# Patient Record
Sex: Male | Born: 1975 | Race: White | Hispanic: No | Marital: Single | Smoking: Current every day smoker
Health system: Southern US, Community
[De-identification: ages and names within clinical notes are randomized; demographics above are authoritative.]

## PROBLEM LIST (undated history)

## (undated) DIAGNOSIS — G459 Transient cerebral ischemic attack, unspecified: Secondary | ICD-10-CM

## (undated) DIAGNOSIS — B1920 Unspecified viral hepatitis C without hepatic coma: Secondary | ICD-10-CM

## (undated) DIAGNOSIS — I10 Essential (primary) hypertension: Secondary | ICD-10-CM

## (undated) DIAGNOSIS — F419 Anxiety disorder, unspecified: Secondary | ICD-10-CM

## (undated) DIAGNOSIS — F17210 Nicotine dependence, cigarettes, uncomplicated: Secondary | ICD-10-CM

## (undated) HISTORY — PX: ARM DEBRIDEMENT: SHX890

---

## 2000-01-19 ENCOUNTER — Emergency Department (HOSPITAL_COMMUNITY): Admission: EM | Admit: 2000-01-19 | Discharge: 2000-01-19 | Payer: Self-pay | Admitting: Internal Medicine

## 2000-07-16 ENCOUNTER — Ambulatory Visit (HOSPITAL_COMMUNITY): Admission: RE | Admit: 2000-07-16 | Discharge: 2000-07-16 | Payer: Self-pay | Admitting: Gastroenterology

## 2001-04-07 ENCOUNTER — Emergency Department (HOSPITAL_COMMUNITY): Admission: EM | Admit: 2001-04-07 | Discharge: 2001-04-07 | Payer: Self-pay | Admitting: Emergency Medicine

## 2001-04-09 ENCOUNTER — Emergency Department (HOSPITAL_COMMUNITY): Admission: EM | Admit: 2001-04-09 | Discharge: 2001-04-09 | Payer: Self-pay | Admitting: Emergency Medicine

## 2002-02-25 ENCOUNTER — Encounter: Payer: Self-pay | Admitting: Emergency Medicine

## 2002-02-25 ENCOUNTER — Emergency Department (HOSPITAL_COMMUNITY): Admission: EM | Admit: 2002-02-25 | Discharge: 2002-02-25 | Payer: Self-pay | Admitting: Emergency Medicine

## 2002-04-29 ENCOUNTER — Encounter: Payer: Self-pay | Admitting: Emergency Medicine

## 2002-04-29 ENCOUNTER — Emergency Department (HOSPITAL_COMMUNITY): Admission: EM | Admit: 2002-04-29 | Discharge: 2002-04-29 | Payer: Self-pay | Admitting: Emergency Medicine

## 2003-01-29 ENCOUNTER — Emergency Department (HOSPITAL_COMMUNITY): Admission: EM | Admit: 2003-01-29 | Discharge: 2003-01-29 | Payer: Self-pay | Admitting: Emergency Medicine

## 2003-05-21 ENCOUNTER — Emergency Department (HOSPITAL_COMMUNITY): Admission: EM | Admit: 2003-05-21 | Discharge: 2003-05-21 | Payer: Self-pay | Admitting: Emergency Medicine

## 2003-11-08 ENCOUNTER — Inpatient Hospital Stay (HOSPITAL_COMMUNITY): Admission: EM | Admit: 2003-11-08 | Discharge: 2003-11-16 | Payer: Self-pay | Admitting: Emergency Medicine

## 2004-05-01 ENCOUNTER — Emergency Department (HOSPITAL_COMMUNITY): Admission: EM | Admit: 2004-05-01 | Discharge: 2004-05-02 | Payer: Self-pay | Admitting: Emergency Medicine

## 2004-06-16 ENCOUNTER — Emergency Department (HOSPITAL_COMMUNITY): Admission: EM | Admit: 2004-06-16 | Discharge: 2004-06-16 | Payer: Self-pay | Admitting: Emergency Medicine

## 2004-08-14 ENCOUNTER — Observation Stay (HOSPITAL_COMMUNITY): Admission: EM | Admit: 2004-08-14 | Discharge: 2004-08-15 | Payer: Self-pay | Admitting: Emergency Medicine

## 2004-08-29 ENCOUNTER — Emergency Department (HOSPITAL_COMMUNITY): Admission: EM | Admit: 2004-08-29 | Discharge: 2004-08-30 | Payer: Self-pay | Admitting: Emergency Medicine

## 2004-09-23 ENCOUNTER — Emergency Department (HOSPITAL_COMMUNITY): Admission: EM | Admit: 2004-09-23 | Discharge: 2004-09-23 | Payer: Self-pay | Admitting: Emergency Medicine

## 2005-02-09 ENCOUNTER — Emergency Department (HOSPITAL_COMMUNITY): Admission: EM | Admit: 2005-02-09 | Discharge: 2005-02-09 | Payer: Self-pay | Admitting: Emergency Medicine

## 2005-03-09 ENCOUNTER — Observation Stay (HOSPITAL_COMMUNITY): Admission: EM | Admit: 2005-03-09 | Discharge: 2005-03-11 | Payer: Self-pay | Admitting: Emergency Medicine

## 2005-05-16 ENCOUNTER — Emergency Department (HOSPITAL_COMMUNITY): Admission: EM | Admit: 2005-05-16 | Discharge: 2005-05-16 | Payer: Self-pay | Admitting: Emergency Medicine

## 2005-08-21 ENCOUNTER — Emergency Department (HOSPITAL_COMMUNITY): Admission: EM | Admit: 2005-08-21 | Discharge: 2005-08-21 | Payer: Self-pay | Admitting: *Deleted

## 2005-12-12 ENCOUNTER — Emergency Department (HOSPITAL_COMMUNITY): Admission: EM | Admit: 2005-12-12 | Discharge: 2005-12-12 | Payer: Self-pay | Admitting: Emergency Medicine

## 2006-01-24 ENCOUNTER — Emergency Department (HOSPITAL_COMMUNITY): Admission: EM | Admit: 2006-01-24 | Discharge: 2006-01-24 | Payer: Self-pay | Admitting: *Deleted

## 2006-01-25 ENCOUNTER — Emergency Department (HOSPITAL_COMMUNITY): Admission: EM | Admit: 2006-01-25 | Discharge: 2006-01-25 | Payer: Self-pay | Admitting: Emergency Medicine

## 2006-02-23 ENCOUNTER — Emergency Department (HOSPITAL_COMMUNITY): Admission: EM | Admit: 2006-02-23 | Discharge: 2006-02-23 | Payer: Self-pay | Admitting: Emergency Medicine

## 2006-05-24 ENCOUNTER — Emergency Department (HOSPITAL_COMMUNITY): Admission: EM | Admit: 2006-05-24 | Discharge: 2006-05-25 | Payer: Self-pay | Admitting: Emergency Medicine

## 2006-09-27 ENCOUNTER — Emergency Department (HOSPITAL_COMMUNITY): Admission: EM | Admit: 2006-09-27 | Discharge: 2006-09-28 | Payer: Self-pay | Admitting: Emergency Medicine

## 2006-10-04 ENCOUNTER — Emergency Department (HOSPITAL_COMMUNITY): Admission: EM | Admit: 2006-10-04 | Discharge: 2006-10-05 | Payer: Self-pay | Admitting: Emergency Medicine

## 2006-10-05 ENCOUNTER — Emergency Department (HOSPITAL_COMMUNITY): Admission: EM | Admit: 2006-10-05 | Discharge: 2006-10-05 | Payer: Self-pay | Admitting: Emergency Medicine

## 2006-11-04 ENCOUNTER — Emergency Department (HOSPITAL_COMMUNITY): Admission: EM | Admit: 2006-11-04 | Discharge: 2006-11-04 | Payer: Self-pay | Admitting: Emergency Medicine

## 2006-12-10 ENCOUNTER — Emergency Department (HOSPITAL_COMMUNITY): Admission: EM | Admit: 2006-12-10 | Discharge: 2006-12-10 | Payer: Self-pay | Admitting: Emergency Medicine

## 2006-12-14 ENCOUNTER — Ambulatory Visit (HOSPITAL_COMMUNITY): Admission: RE | Admit: 2006-12-14 | Discharge: 2006-12-14 | Payer: Self-pay | Admitting: Orthopedic Surgery

## 2007-10-03 ENCOUNTER — Inpatient Hospital Stay (HOSPITAL_COMMUNITY): Admission: EM | Admit: 2007-10-03 | Discharge: 2007-10-08 | Payer: Self-pay | Admitting: Emergency Medicine

## 2008-06-19 ENCOUNTER — Emergency Department (HOSPITAL_COMMUNITY): Admission: EM | Admit: 2008-06-19 | Discharge: 2008-06-20 | Payer: Self-pay | Admitting: Emergency Medicine

## 2008-06-29 ENCOUNTER — Ambulatory Visit (HOSPITAL_COMMUNITY): Admission: RE | Admit: 2008-06-29 | Discharge: 2008-06-29 | Payer: Self-pay | Admitting: Plastic Surgery

## 2008-08-06 ENCOUNTER — Encounter: Admission: RE | Admit: 2008-08-06 | Discharge: 2008-08-27 | Payer: Self-pay | Admitting: Plastic Surgery

## 2008-10-01 ENCOUNTER — Emergency Department (HOSPITAL_BASED_OUTPATIENT_CLINIC_OR_DEPARTMENT_OTHER): Admission: EM | Admit: 2008-10-01 | Discharge: 2008-10-01 | Payer: Self-pay | Admitting: Emergency Medicine

## 2009-01-30 ENCOUNTER — Emergency Department (HOSPITAL_BASED_OUTPATIENT_CLINIC_OR_DEPARTMENT_OTHER): Admission: EM | Admit: 2009-01-30 | Discharge: 2009-01-30 | Payer: Self-pay | Admitting: Emergency Medicine

## 2009-01-31 ENCOUNTER — Emergency Department (HOSPITAL_COMMUNITY): Admission: EM | Admit: 2009-01-31 | Discharge: 2009-01-31 | Payer: Self-pay | Admitting: Emergency Medicine

## 2009-03-23 ENCOUNTER — Emergency Department (HOSPITAL_COMMUNITY): Admission: EM | Admit: 2009-03-23 | Discharge: 2009-03-23 | Payer: Self-pay | Admitting: Emergency Medicine

## 2009-10-24 ENCOUNTER — Emergency Department (HOSPITAL_COMMUNITY): Admission: EM | Admit: 2009-10-24 | Discharge: 2009-10-24 | Payer: Self-pay | Admitting: Emergency Medicine

## 2010-04-17 ENCOUNTER — Encounter: Payer: Self-pay | Admitting: *Deleted

## 2010-06-11 LAB — BASIC METABOLIC PANEL
Chloride: 105 mEq/L (ref 96–112)
Creatinine, Ser: 0.77 mg/dL (ref 0.4–1.5)
GFR calc Af Amer: >60 mL/min (ref >60)
Potassium: 3.9 mEq/L (ref 3.5–5.1)

## 2010-06-11 LAB — RAPID URINE DRUG SCREEN, HOSP PERFORMED: Benzodiazepines: POSITIVE — AB

## 2010-06-11 LAB — CBC
MCV: 91.3 fL (ref 78.0–100.0)
Platelets: 156 10*3/uL (ref 150–400)
RBC: 5.52 MIL/uL (ref 4.22–5.81)
WBC: 9.8 10*3/uL (ref 4.0–10.5)

## 2010-06-11 LAB — DIFFERENTIAL
Basophils Absolute: 0.1 10*3/uL (ref 0.0–0.1)
Basophils Relative: 1 % (ref 0–1)
Eosinophils Absolute: 0.1 10*3/uL (ref 0.0–0.7)
Monocytes Absolute: 1 10*3/uL (ref 0.1–1.0)
Neutro Abs: 4.1 10*3/uL (ref 1.7–7.7)

## 2010-06-11 LAB — ETHANOL: Alcohol, Ethyl (B): 321 mg/dL — ABNORMAL HIGH (ref 0–10)

## 2010-06-27 LAB — COMPREHENSIVE METABOLIC PANEL
ALT: 207 U/L — ABNORMAL HIGH (ref 0–53)
Alkaline Phosphatase: 81 U/L (ref 39–117)
BUN: 8 mg/dL (ref 6–23)
CO2: 30 mEq/L (ref 19–32)
Chloride: 106 mEq/L (ref 96–112)
GFR calc non Af Amer: >60 mL/min (ref >60)
Glucose, Bld: 116 mg/dL — ABNORMAL HIGH (ref 70–99)
Potassium: 3.5 mEq/L (ref 3.5–5.1)
Sodium: 145 mEq/L (ref 135–145)
Total Bilirubin: 0.4 mg/dL (ref 0.3–1.2)

## 2010-06-27 LAB — CBC
HCT: 49 % (ref 39.0–52.0)
Hemoglobin: 16.7 g/dL (ref 13.0–17.0)
RBC: 5.41 MIL/uL (ref 4.22–5.81)
RDW: 13.1 % (ref 11.5–15.5)
WBC: 7.4 10*3/uL (ref 4.0–10.5)

## 2010-06-27 LAB — RAPID URINE DRUG SCREEN, HOSP PERFORMED: Cocaine: NOT DETECTED

## 2010-06-27 LAB — ETHANOL: Alcohol, Ethyl (B): 316 mg/dL — ABNORMAL HIGH (ref 0–10)

## 2010-08-09 NOTE — Discharge Summary (Signed)
NAMEFAWZI, MELMAN NO.:  [id number]   MEDICAL RECORD NO.:  [id number]          PATIENT TYPE:  INP   LOCATION:  1503                         FACILITY:  [facility]   PHYSICIAN:  Shirley Friar, MDDATE OF BIRTH:  1975/07/22   DATE OF ADMISSION:  10/02/2007  DATE OF DISCHARGE:                               DISCHARGE SUMMARY   REQUESTING PHYSICIAN:  Lonia Blood, M.D.   REASON FOR CONSULT:  Abdominal pain, NSAID abuse.   HISTORY OF PRESENT ILLNESS:  Mr. Brandstetter is a 35 year old white male who  is unassigned and being seen at the request of Dr. Sharon Seller regarding  abdominal pain and NSAID abuse.  He has a history of alcohol abuse and  came in on October 02, 2007 with 3 days of right lower quadrant abdominal  pain that was non radiating, without any associated nausea, vomiting or  diarrhea.  He felt that the pain was worse with eating, coughing, or  moving his body.  He had a CT scan done, which was negative for any  acute abnormality.  He did have fatty infiltration of the liver seen.  Abdominal ultrasound was done, which was negative for gallstones.  An  upper GI series was ordered by the primary team, but the patient had too  much contrast in his colon, so that procedure was postponed.  Dr.  Sharon Seller called this consultation on October 05, 2007 to see about his  abdominal pain and evaluate for a possible ulcer.  Mr. Karapetian reports  using 3 BC powders a day for several months.  He drinks half to a fifth  of liquor per day and recently states he has been drinking a fifth of  vodka per day.  He reports a history of what sounds like Mallory-Weiss  tear about 10 years ago, but denies any known history of stomach ulcers.   PAST MEDICAL HISTORY:  Alcohol abuse, recent diagnosis of hepatitis C,  tobacco abuse.   MEDICINES ON ADMISSION:  None.   ALLERGIES:  NO KNOWN DRUG ALLERGIES.   FAMILY HISTORY:  Noncontributory.   SOCIAL HISTORY:  Positive alcohol abuse and  tobacco abuse as stated  above.   REVIEW OF SYSTEMS:  Negative from a GI standpoint, except as stated  above.   PHYSICAL EXAMINATION:  VITAL SIGNS:  Temperature 98.5, pulse 80, blood  pressure 142/98, O2 sat 97% on room air.  GENERAL:  Alert, in no acute distress.  ABDOMEN:  Right lower quadrant and right upper quadrant tenderness with  guarding, otherwise nontender, soft, nondistended, positive bowel  sounds.   IMPRESSION:  A 35 year old white male with right-sided abdominal pain  that is worse with eating, as well as coughing or body movement.  He  denies any associated nausea or vomiting.  The patient does have  extensive NSAID use history and alcohol abuse history.  He will have an  upper endoscopy to evaluate for portal hypertensive gastropathy versus  peptic ulcer disease versus hemorrhagic gastritis versus erosive  esophagitis.  My main concern  would be for peptic ulcer with his NSAID abuse, but also due to  his  alcohol abuse he could have portal hypertensive gastropathy.  I  discussed risks, benefits of the upper endoscopy and he agrees to  proceed.  Procedure has been planned for October 06, 2007 and he will be  n.p.o. after midnight.      Shirley Friar, MD  Electronically Signed     VCS/MEDQ  D:  10/05/2007  T:  10/05/2007  Job:  [id number]

## 2010-08-09 NOTE — Consult Note (Signed)
NAMEDEAKIN, LACEK NO.:  [id number]   MEDICAL RECORD NO.:  [id number]          PATIENT TYPE:  INP   LOCATION:  1503                         FACILITY:  [facility]   PHYSICIAN:  Clovis Pu. Cornett, M.D.DATE OF BIRTH:  Sep 11, 1975   DATE OF CONSULTATION:  10/03/2007  DATE OF DISCHARGE:                                 CONSULTATION   PHYSICIAN REQUESTING CONSULTATION:  [facility] physician.   REASON FOR CONSULTATION:  Abdominal pain.   HISTORY OF PRESENT ILLNESS:  The patient is a 35 year old male with a 3-  day history of right lower quadrant pain.  The pain started 3 days ago.  It has been progressing and is located in his right lower quadrant.  He  also complains of a band of pain from his right to his left involving  his left lower quadrant.  He has had no nausea or vomiting.  He does  drink heavy alcohol, up to a fifth of liquor a day.  He has had no  nausea or vomiting.  He states the pain is a pressure pain pushing out,  especially in his right side of his abdomen.  He has had no problems  with bowel or bladder function.  No diarrhea or constipation.  He has  been in and out of Alcoholics Anonymous to help him try and stop  drinking.  He underwent an abdominal-pelvic CT scan which was normal  this morning.  He had a low-grade white count of 11,200 without left  shift.  I was asked to see him at the request of the hospitalists to  evaluate him for possible appendicitis and to help further evaluate his  abdominal pain.   PAST MEDICAL HISTORY:  Heavy alcohol use.   PAST SURGICAL HISTORY:  Left arm surgery.   MEDICATIONS:  None.   ALLERGIES:  None.   SOCIAL HISTORY:  Does drink alcohol.   FAMILY HISTORY:  Noncontributory.   REVIEW OF SYSTEMS:  As above x15, otherwise negative.   PHYSICAL EXAMINATION:  Temperature 98.2, heart rate 77, blood pressure  122/84.  GENERAL APPEARANCE:  A pleasant male in no apparent distress.  HEENT:  Extraocular  movements are intact.  Oropharynx clear.  NECK:  Supple, nontender.  Trachea midline.  LUNGS:  Clear to auscultation bilaterally.  Chest wall motion normal.  CARDIOVASCULAR:  Regular rate and rhythm without rub, murmurs or gallop.  EXTREMITIES:  Warm and well-perfused.  ABDOMEN:  Mildly obese.  He is tender over his liver edge, palpable in  the right upper quadrant.  He also has some tenderness in his right  lower quadrant but no rebound or guarding.  His left lower quadrant  shows some tenderness as well but again no significant rebound or  guarding to palpation.  His abdomen is mildly distended.  GU:  No evidence of inguinal hernia.  EXTREMITIES:  No clubbing, cyanosis, or edema.  Muscle tone normal.  Range of motion normal.  NEUROLOGIC:  Glasgow Coma Scale 15.  Motor and sensory function grossly  intact.   DIAGNOSTIC STUDIES:  I have reviewed his CT scan of his  abdomen and  pelvis myself.  The appendix is visualized with air in it without  swelling or inflammation.  The remainder of his GI tract looks normal.  There is fatty infiltration of his liver noted.   LABORATORY STUDIES:  Urinalysis normal.  Alcohol level is less than 5.  White count 11,200 without a left shift, hemoglobin 17.3, platelet count  is 160,000.  Electrolytes were within normal limits.  AST and ALT are  113 and 40, which are elevated.  Amylase and lipase normal.  Bilirubin  total 0.6, which is normal.   IMPRESSION:  Abdominal pain with history of heavy alcohol use.   PLAN:  After reviewing his CT  and after examining the patient, the  likelihood of appendicitis in this setting, I think, is low.  Certainly  he needs to be followed, made n.p.o., given counseling about his alcohol  use.  He is tender over his liver edge and I suspect that he has some  alcoholic hepatitis to explain some of his abdominal complaints.  This  also could be due to his heavy drinking as well.  I see no indication  for surgery at this  point in time and agree with IV antibiotics and  fluids, to follow him.  He has no left shift and his CT scan is normal.  After 3 days I would expect to see a lot more, and his physical  examination does not match appendicitis either since he is sore all over  and does not have any localized signs at this point.  He does not have  peritonitis, though.  I talk to him about this and he understands, and I  agree with medical management and follow-up.  Laparoscopy may or may not  be needed depending on how he does.      Thomas A. Cornett, M.D.  Electronically Signed     TAC/MEDQ  D:  10/03/2007  T:  10/03/2007  Job:  [id number]   cc:   [facility]

## 2010-08-09 NOTE — Op Note (Signed)
Daniel Solis, Daniel [name]:  [id number]   MEDICAL RECORD NO.:  [id number]          PATIENT TYPE:  INP   LOCATION:  1503                         FACILITY:  [facility]   PHYSICIAN:  Shirley Friar, MDDATE OF BIRTH:  1975-10-14   DATE OF PROCEDURE:  DATE OF DISCHARGE:                               OPERATIVE REPORT   PROCEDURE:  Upper endoscopy.   INDICATION:  Abdominal pain, NSAID abuse.   MEDICATIONS:  Fentanyl 75 mcg IV, Versed 7.5 mg IV, Phenergan 12.5 mg  IV.   FINDINGS:  Endoscope was inserted through the oropharynx and the  esophagus was intubated which was normal in its entirety.  Endoscope was  advanced down to the stomach which revealed normal stomach mucosa  without any ulcer seen.  Retroflexion was done which revealed normal  proximal stomach.  Endoscope was straightened and advanced into the  duodenal bulb and second portion of duodenum which were both normal.  Endoscope was withdrawn to confirm above findings.   ASSESSMENT:  Normal upper endoscopy.   PLAN:  Avoid NSAIDs.  If abdominal pain persists may need to do a small  bowel follow-through versus capsule endoscopy to further evaluate.      Shirley Friar, MD  Electronically Signed     VCS/MEDQ  D:  10/06/2007  T:  10/06/2007  Job:  [id number]

## 2010-08-09 NOTE — H&P (Signed)
NAME:  FED, CECI NO.:  [id number]   MEDICAL RECORD NO.:  [id number]          PATIENT TYPE:  EMS   LOCATION:  ED                           FACILITY:  [facility]   PHYSICIAN:  Mobolaji B. Bakare, M.D.DATE OF BIRTH:  Jun 06, 1975   DATE OF ADMISSION:  10/02/2007  DATE OF DISCHARGE:                              HISTORY & PHYSICAL   PRIMARY CARE PHYSICIAN:  Unassigned.   CHIEF COMPLAINTS:  Right lower quadrant pain.   HISTORY OF PRESENTING COMPLAINT:  Mr. Felten is a 35 year old Caucasian  male with no known past medical history other than alcohol abuse.  He  started experiencing right lower quadrant pain about 3 days ago.  The  pain is nonradiating, its at times been constant and has period of  escalation.  It is it is aggravated by coughing, body movement.  There  is no associated nausea or vomiting.  No changes in his bowels, no  diarrhea or constipation.  He has had a CT scan of the abdomen and  pelvis which showed no acute abnormality.  The patient has also noticed  subjective fever with chills.  He has not had this type of pain before.   REVIEW OF SYSTEMS:  He denies shortness of breath, chest pain, cough,  dysuria, urgency or increased frequency or micturition.  He has some  headaches from lack of sleep.   PAST MEDICAL HISTORY:  1. Alcohol abuse.  2. Ongoing tobacco abuse.  3. Remote history of staph infection left forearm that required skin      grafting.   PAST SURGICAL HISTORY:  Skin graft to left forearm.   SOCIAL HISTORY:  He smokes cigarettes one pack per day and he drinks  about a fifth of vodka.  The patient goes to Alcohol Anonymous, but  confessed that he has relapsed.  He works in [employer].   FAMILY HISTORY:  Both parents are well.  He is not aware of any chronic  illnesses.   PHYSICAL EXAMINATION:  VITAL SIGNS:  Initial temperature 98.4, blood  pressure 182/126, pulse of 111, respiratory rate of 20.  O2 sats of 97%  on room  air.  GENERAL:  The patient is uncomfortable due to pain.  Not in acute  respiratory distress.  He is flushed, not pale.  Anicteric.  NECK:  No elevated JVD.  No carotid bruit.  HEENT:  Mucous membranes dry.  LUNGS:  Bilateral expiratory rhonchi.  No crackles.  CVS:  S1-S2 regular.  ABDOMEN:  Not distended.  Soft, but there is significant tenderness in  the right lower quadrant.  No rebound, tenderness or guarding.  Rovsing  sign is positive.  Bowel sounds present.  No palpable organomegaly.  EXTREMITIES:  No pedal edema or calf tenderness.  Dorsalis pedis pulses  palpable bilaterally.  CNS: No focal neurological deficit.   INITIAL LABORATORY DATA:  Lipase 23.  Alcohol level less than 5.  Sodium  140, potassium 3.5, chloride 103, glucose 93, bicarb 26.  BUN 10,  creatinine 0.83.  AST 113, ALT 140, alkaline phosphatase 82.  Total  protein 7.4, albumin  3.3, calcium 9.2.  Urinalysis shows specific  gravity of 1.026.  Negative for nitrites and leukocyte esterase.  White  cell 11.2, hemoglobin 17.3, hematocrit 50.1, platelets 160.  Absolute  monocyte count of 1.6.  CT scan abdomen and pelvis shows diffuse fatty infiltration of liver, no  acute abnormality noted.  Specifically, the appendix is normal.  No  enlarged lymph nodes.  No mass or urinary calculi noted.   ASSESSMENT AND PLAN:  Mr. Gabler is a 35 year old Caucasian male  presenting with right lower quadrant pain, subjective fever and  leukocytosis.  CT scan of the abdomen is unremarkable.  He has abnormal  liver function tests and fatty infiltration of the liver noted on CT  scan.  He has significant alcohol use with high potential for  withdrawal.  The patient will be admitted for further evaluation and  treatment.   ADMISSION DIAGNOSES:  1. Right lower quadrant pain.  Etiology is unclear.  Concern is for      acute appendicitis.  I have consulted surgery.  He will be kept      n.p.o., placed on IV fluid, pain control with  Dilaudid and      antibiotics.  Blood cultures will be drawn.  2. Alcohol abuse with high potential for withdrawal.  Will start      Ativan withdrawal protocol, thiamine and multivitamin.  3. Hypertension.  Blood pressure is probably high because of      undiagnosed hypertension, now compounded by pain.  Will give p.r.n.      labetalol and monitor blood pressure during the course of      hospitalization.  He may require antihypertensive.  4. Dehydration.  Will hydrate with IV fluids.  5..  Tobacco abuse.  Will place on nicotine patch 21 mg daily and offer  tobacco cessation counseling.  The patient has some wheeze and some  rhonchi both lungs.  Will nebulize with Albuterol and Atrovent  1. Fatty infiltration of the liver with abnormal transaminases.      AST/ALT ratio is not keeping with alcohol-related hepatitis.  Will      check acute hepatitis panel.  CT scan of the abdomen is      unremarkable.      Mobolaji B. Corky Downs, M.D.  Electronically Signed     MBB/MEDQ  D:  10/03/2007  T:  10/03/2007  Job:  [id number]

## 2010-08-12 NOTE — Procedures (Signed)
[place]  Patient:    Daniel Solis, Daniel Solis.                    MRN: [id number] Proc. Date: 07/16/00 Attending:  Verlin Grills, M.D. CC:         Oley Balm. Georgina Pillion, M.D.                           Procedure Report  DATE OF BIRTH:  05-05-1975  REFERRING PHYSICIAN:  Oley Balm. Georgina Pillion, M.D.  PROCEDURE PERFORMED:  Esophagogastroduodenoscopy.  ENDOSCOPIST:  Verlin Grills, M.D.  INDICATIONS FOR PROCEDURE:  The patient is a 35 year old male who developed nausea, vomiting and hematemesis last week.  His upper gastrointestinal bleeding has resolved.  I discussed with Daniel Solis, the complications associated with esophagogastroduodenoscopy including intestinal bleeding and intestinal perforation.  Daniel Solis has signed the operative permit.  PREMEDICATION:  Fentanyl 100 mcg, Versed 10 mg.  ENDOSCOPIST:  Olympus gastroscope.  DESCRIPTION OF PROCEDURE:  After obtaining informed, the patient was placed inthe left lateral decubitus position.  I administered intravenous fentanyl and intravenous Versed to achieve conscious sedation for the procedure.  The patients blood pressure, oxygen saturations and cardiac rhythm were monitored throughout the procedure and documented in the medical record.  The Olympus gastroscope was passed through the posterior hypopharynx into the proximal esophagus without difficulty.  The hypopharynx, larynx and vocal cords were normal.  Esophagoscopy:  The proximal, mid and lower segments of the esophagus appeared normal.  Gastroscopy:  Retroflex view of the gastric cardia and fundus was normal.  The gastric body, antrum and pylorus appeared normal.  Duodenoscopy:  The duodenal bulb and descending duodenum appeared normal.  ASSESSMENT:  Normal esophagogastroduodenoscopy. DD:  07/16/00 TD:  07/16/00 Job: [id number]

## 2010-08-12 NOTE — Discharge Summary (Signed)
[name], [name]:  [id number]   MEDICAL RECORD NO.:  [id number]          PATIENT TYPE:  INP   LOCATION:  1503                         FACILITY:  [facility]   PHYSICIAN:  Lonia Blood, M.D.      DATE OF BIRTH:  21-Oct-1975   DATE OF ADMISSION:  10/02/2007  DATE OF DISCHARGE:  10/08/2007                               DISCHARGE SUMMARY   ADDENDUM:  Please refer to discharge summary on October 05 2007 by Dr. Jetty Duhamel.   DISCHARGE DIAGNOSIS:  1. Unexplained abdominal pain.  2. Hepatitis C.  3. Alcohol abuse.  4. Hypertension.  5. Tobacco abuse.  6. Possible narcotic dependence.   DISCHARGE MEDICATIONS:  The patient left the hospital against medical  advice.  Hence, no prescription was given.  He was on no pain medicine  prior to coming in.   PROCEDURES PERFORMED THIS ADMISSION:  1. CT abdomen and pelvis on October 03, 2007 that showed no acute      abnormalities.  2. Chest x-ray on July 9 that showed poor inspiratory effort but no      definite acute disease.  3. Abdominal ultrasound on July 9 showed no evidence of gallstones,      some fatty infiltration of the liver was seen.  4. An abdominal x-ray on July 10 showed retained contrast in the      colon.  An upper GI series was therefore not concluded.  5. Upper endoscopy performed by Dr. Bosie Clos on July 12 that was      normal.   CONSULTATIONS:  [facility] Dr. Charlott Rakes.   BRIEF HISTORY AND PHYSICAL:  Please refer to dictated history and  physical on July 9 by Dr. Fatima Sanger B. Bakare.  In short however, Daniel Solis is a 35 year old white male with a of alcohol at home and tobacco  abuse that came in with complaint of the right lower quadrant abdominal  pain for about 3 days.  The patient had initial evaluation that was  unrevealing and was subsequently admitted for further management in the  hospital.   HOSPITAL COURSE:  1. Abdominal pain.  The patient was fully evaluated for his right  lower quadrant abdominal pain.  GI was involved as well.  All      workup performed showed no clear-cut source of the patient's      abdominal pain.  The patient was informed about this, but continued      to complain about pain.  Finally, the patient was referred to Pain      Clinic for probable pain management, since no actual reversible      cause was found.  The patient left the      hospital against medical advice prior to arrangements for pain      clinic referral.  2. Tobacco abuse.  The patient was counseled on both tobacco and      alcohol use.  He had some nicotine patches in the hospital, which      he rejected.  Otherwise as indicated, he left the hospital against  medical advice.      Lonia Blood, M.D.  Electronically Signed     LG/MEDQ  D:  11/03/2007  T:  11/03/2007  Job:  [id number]

## 2010-12-09 ENCOUNTER — Encounter: Payer: Self-pay | Admitting: *Deleted

## 2010-12-09 ENCOUNTER — Emergency Department (HOSPITAL_BASED_OUTPATIENT_CLINIC_OR_DEPARTMENT_OTHER)
Admission: EM | Admit: 2010-12-09 | Discharge: 2010-12-10 | Disposition: A | Discharge status: Home / Self Care | Payer: Medicaid Other | Attending: Emergency Medicine | Admitting: Emergency Medicine

## 2010-12-09 DIAGNOSIS — H5710 Ocular pain, unspecified eye: Secondary | ICD-10-CM | POA: Insufficient documentation

## 2010-12-09 DIAGNOSIS — F17200 Nicotine dependence, unspecified, uncomplicated: Secondary | ICD-10-CM | POA: Insufficient documentation

## 2010-12-09 MED ORDER — TETRACAINE HCL 0.5 % OP SOLN
OPHTHALMIC | Status: AC
  Administered 2010-12-09
  Filled 2010-12-09: qty 2

## 2010-12-09 MED ORDER — FLUORESCEIN SODIUM 1 MG OP STRP
ORAL_STRIP | OPHTHALMIC | Status: AC
  Administered 2010-12-09
  Filled 2010-12-09: qty 1
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
## 2010-12-09 NOTE — ED Provider Notes (Signed)
History     CSN: [id number] Arrival date & time: 12/09/2010 11:02 PM   Chief Complaint  Patient presents with  . Eye Injury     (Include location/radiation/quality/duration/timing/severity/associated sxs/prior treatment) Patient is a 35 y.o. male presenting with eye injury. The history is provided by the patient.  Eye Injury This is a new problem. The current episode started 6 to 12 hours ago. The problem occurs constantly. The problem has been gradually worsening. Pertinent negatives include no chest pain, no abdominal pain, no headaches and no shortness of breath. The symptoms are aggravated by nothing. The symptoms are relieved by nothing. He has tried a cold compress for the symptoms.   Mowing the lawn and something hit him in the eye, since them persistent severe p[ain feels like something is scratching his eye  History reviewed. No pertinent past medical history.   History reviewed. No pertinent past surgical history.  No family history on file.  History  Substance Use Topics  . Smoking status: Current Everyday Smoker -- 1.0 packs/day  . Smokeless tobacco: Not on file  . Alcohol Use: No      Review of Systems  Constitutional: Negative for fever and chills.  HENT: Negative for neck pain and neck stiffness.   Eyes: Positive for pain and redness. Negative for photophobia, discharge and visual disturbance.  Respiratory: Negative for shortness of breath.   Cardiovascular: Negative for chest pain.  Gastrointestinal: Negative for abdominal pain.  Genitourinary: Negative for dysuria.  Musculoskeletal: Negative for back pain.  Skin: Negative for rash.  Neurological: Negative for headaches.  All other systems reviewed and are negative.    Allergies  Review of patient's allergies indicates no known allergies.  Home Medications   Current Outpatient Rx  Name Route Sig Dispense Refill  . ONE-DAILY MULTI VITAMINS PO TABS Oral Take 1 tablet by mouth daily.      Marland Kitchen  PRESCRIPTION MEDICATION Injection Inject as directed every 7 (seven) days. Pegysis Treatment     . VISINE OP Right Eye Place 3-4 drops into the right eye once.        Physical Exam    BP 159/108  Pulse 77  Temp(Src) 98.1 F (36.7 C) (Oral)  Resp 20  SpO2 100%  Physical Exam  Constitutional: He is oriented to person, place, and time. He appears well-developed and well-nourished.  HENT:  Head: Normocephalic and atraumatic.  Eyes: EOM are normal. Pupils are equal, round, and reactive to light.       R eye conj injected, no obvious FB or ulcer by direct visualization, after fluoro and tetracaine and slit lamp exam: very small circular area of min dye uptake, no apparent FB or abrasion.   Neck: Full passive range of motion without pain. Neck supple. No thyromegaly present.       No meningismus  Cardiovascular: Normal rate, regular rhythm, S1 normal, S2 normal and intact distal pulses.   Pulmonary/Chest: Effort normal and breath sounds normal.  Abdominal: Soft. Bowel sounds are normal. There is no tenderness. There is no CVA tenderness.  Musculoskeletal: Normal range of motion.  Neurological: He is alert and oriented to person, place, and time. He has normal strength and normal reflexes. No cranial nerve deficit or sensory deficit. He displays a negative Romberg sign. GCS eye subscore is 4. GCS verbal subscore is 5. GCS motor subscore is 6.       Normal Gait  Skin: Skin is warm and dry. No rash noted. No cyanosis. Nails show no  clubbing.  Psychiatric: He has a normal mood and affect. His speech is normal and behavior is normal.    ED Course  Procedures  Results for orders placed during the hospital encounter of 10/24/09  DRUG SCREEN PANEL, EMERGENCY      Component Value Range   Opiates NONE DETECTED  NONE DETECTED    Cocaine NONE DETECTED  NONE DETECTED    Benzodiazepines POSITIVE (*) NONE DETECTED    Amphetamines NONE DETECTED  NONE DETECTED    Tetrahydrocannabinol NONE DETECTED   NONE DETECTED    Barbiturates    NONE DETECTED    Value: NONE DETECTED            DRUG SCREEN FOR MEDICAL PURPOSES     ONLY.  IF CONFIRMATION IS NEEDED     FOR ANY PURPOSE, NOTIFY LAB     WITHIN 5 DAYS.                LOWEST DETECTABLE LIMITS     FOR URINE DRUG SCREEN     Drug Class       Cutoff (ng/mL)     Amphetamine      1000     Barbiturate      200     Benzodiazepine   200     Tricyclics       300     Opiates          300     Cocaine          300     THC              50  ETHANOL      Component Value Range   Alcohol, Ethyl (B)   (*) 0 - 10 (mg/dL)   Value: 308            LOWEST DETECTABLE LIMIT FOR     SERUM ALCOHOL IS 5 mg/dL     FOR MEDICAL PURPOSES ONLY  BASIC METABOLIC PANEL      Component Value Range   Sodium 144  135 - 145 (mEq/L)   Potassium 3.9  3.5 - 5.1 (mEq/L)   Chloride 105  96 - 112 (mEq/L)   CO2 26  19 - 32 (mEq/L)   Glucose, Bld 98  70 - 99 (mg/dL)   BUN 8  6 - 23 (mg/dL)   Creatinine, Ser 6.57  0.4 - 1.5 (mg/dL)   Calcium 9.1  8.4 - 84.6 (mg/dL)   GFR calc non Af Amer >60  >60 (mL/min)   GFR calc Af Amer    >60 (mL/min)   Value: >60            The eGFR has been calculated     using the MDRD equation.     This calculation has not been     validated in all clinical     situations.     eGFR's persistently     <60 mL/min signify     possible Chronic Kidney Disease.  CBC      Component Value Range   WBC 9.8  4.0 - 10.5 (K/uL)   RBC 5.52  4.22 - 5.81 (MIL/uL)   Hemoglobin 17.6 (*) 13.0 - 17.0 (g/dL)   HCT 96.2  95.2 - 84.1 (%)   MCV 91.3  78.0 - 100.0 (fL)   MCH 31.9  26.0 - 34.0 (pg)   MCHC 34.9  30.0 - 36.0 (g/dL)   RDW 32.4  40.1 - 02.7 (%)  Platelets 156  150 - 400 (K/uL)  DIFFERENTIAL      Component Value Range   Neutrophils Relative 42 (*) 43 - 77 (%)   Neutro Abs 4.1  1.7 - 7.7 (K/uL)   Lymphocytes Relative 47 (*) 12 - 46 (%)   Lymphs Abs 4.6 (*) 0.7 - 4.0 (K/uL)   Monocytes Relative 10  3 - 12 (%)   Monocytes Absolute 1.0  0.1 -  1.0 (K/uL)   Eosinophils Relative 1  0 - 5 (%)   Eosinophils Absolute 0.1  0.0 - 0.7 (K/uL)   Basophils Relative 1  0 - 1 (%)   Basophils Absolute 0.1  0.0 - 0.1 (K/uL)   No results found.   No diagnosis found.   MDM  R eye pain no obvious FB but very small area of dye uptake. E mycin, pain meds and Ophth referral provided. VAs reviewed.        Sunnie Nielsen, MD 12/10/10 [phone]

## 2010-12-10 MED ORDER — ERYTHROMYCIN 5 MG/GM OP OINT: TOPICAL_OINTMENT | OPHTHALMIC | Status: AC

## 2010-12-10 MED ORDER — ERYTHROMYCIN 5 MG/GM OP OINT
TOPICAL_OINTMENT | Freq: Four times a day (QID) | OPHTHALMIC | Status: DC
  Administered 2010-12-10: 02:00:00 via OPHTHALMIC
  Filled 2010-12-10: qty 3.5

## 2010-12-10 MED ORDER — HYDROCODONE-ACETAMINOPHEN 5-325 MG PO TABS
2.0000 | ORAL_TABLET | Freq: Once | ORAL | Status: AC
  Administered 2010-12-10: 2 via ORAL
  Filled 2010-12-10: qty 2

## 2010-12-10 MED ORDER — HYDROCODONE-ACETAMINOPHEN 5-500 MG PO TABS: 1.0000 | ORAL_TABLET | Freq: Four times a day (QID) | ORAL | Status: AC | PRN

## 2010-12-10 NOTE — ED Notes (Signed)
Patient is resting comfortably. 

## 2010-12-10 NOTE — ED Notes (Signed)
Left 20/50 Right 20/50 Both 20/30

## 2010-12-10 NOTE — ED Notes (Signed)
Family at bedside. 

## 2010-12-22 LAB — CBC
HCT: 45.7
HCT: 46.6
MCHC: 34.4
MCHC: 34.5
MCV: 87.1
MCV: 87.1
MCV: 87.4
Platelets: 103 — ABNORMAL LOW
Platelets: 125 — ABNORMAL LOW
Platelets: 160
RBC: 5.35
RDW: 13.3
RDW: 13.4
RDW: 13.8
WBC: 10.9 — ABNORMAL HIGH
WBC: 11.2 — ABNORMAL HIGH
WBC: 6.6

## 2010-12-22 LAB — COMPREHENSIVE METABOLIC PANEL
ALT: 102 — ABNORMAL HIGH
AST: 113 — ABNORMAL HIGH
AST: 81 — ABNORMAL HIGH
AST: 86 — ABNORMAL HIGH
AST: 91 — ABNORMAL HIGH
Albumin: 2.8 — ABNORMAL LOW
Albumin: 3.1 — ABNORMAL LOW
Albumin: 3.3 — ABNORMAL LOW
Alkaline Phosphatase: 74
BUN: 10
BUN: 10
CO2: 27
CO2: 30
Calcium: 8.3 — ABNORMAL LOW
Calcium: 9.1
Chloride: 102
Chloride: 103
Chloride: 103
Chloride: 105
Creatinine, Ser: 0.79
Creatinine, Ser: 0.89
Creatinine, Ser: 0.92
Creatinine, Ser: 1.11
GFR calc Af Amer: >60
GFR calc Af Amer: >60
GFR calc Af Amer: >60
GFR calc Af Amer: >60
GFR calc non Af Amer: >60
GFR calc non Af Amer: >60
Glucose, Bld: 100 — ABNORMAL HIGH
Potassium: 3.5
Potassium: 4
Sodium: 140
Total Bilirubin: 0.9
Total Bilirubin: 1.2
Total Bilirubin: 1.2
Total Protein: 5.9 — ABNORMAL LOW
Total Protein: 6.6
Total Protein: 7.4

## 2010-12-22 LAB — URINALYSIS, ROUTINE W REFLEX MICROSCOPIC
Ketones, ur: NEGATIVE
Ketones, ur: NEGATIVE
Nitrite: NEGATIVE
Protein, ur: NEGATIVE
Protein, ur: NEGATIVE
Urobilinogen, UA: 1
Urobilinogen, UA: 1

## 2010-12-22 LAB — CULTURE, BLOOD (ROUTINE X 2): Culture: NO GROWTH

## 2010-12-22 LAB — DIFFERENTIAL
Basophils Absolute: 0.1
Basophils Absolute: 0.2 — ABNORMAL HIGH
Eosinophils Absolute: 0.1
Eosinophils Relative: 1
Lymphocytes Relative: 35
Lymphocytes Relative: 40
Lymphs Abs: 3.5
Lymphs Abs: 3.9
Monocytes Absolute: 1.1 — ABNORMAL HIGH
Neutro Abs: 3.9
Neutrophils Relative %: 49

## 2010-12-22 LAB — ETHANOL: Alcohol, Ethyl (B): <5

## 2010-12-22 LAB — OCCULT BLOOD X 1 CARD TO LAB, STOOL: Fecal Occult Bld: NEGATIVE

## 2010-12-22 LAB — HEPATITIS PANEL, ACUTE
Hep A IgM: NEGATIVE
Hepatitis B Surface Ag: NEGATIVE

## 2011-01-10 LAB — ETHANOL: Alcohol, Ethyl (B): 133 — ABNORMAL HIGH

## 2011-02-01 ENCOUNTER — Emergency Department (HOSPITAL_COMMUNITY)
Admission: EM | Admit: 2011-02-01 | Discharge: 2011-02-01 | Disposition: A | Discharge status: Home / Self Care | Payer: Medicaid Other | Source: Home / Self Care | Attending: Family Medicine | Admitting: Family Medicine

## 2011-02-01 ENCOUNTER — Encounter (HOSPITAL_COMMUNITY): Payer: Self-pay | Admitting: *Deleted

## 2011-02-01 NOTE — ED Provider Notes (Signed)
Left without being seen after discharge.   Randa Spike, MD 02/01/11 [phone]

## 2011-02-01 NOTE — ED Notes (Signed)
Pt told staff his dentist called him back will see his dentist pt left

## 2011-02-11 ENCOUNTER — Emergency Department (HOSPITAL_COMMUNITY): Payer: Medicaid Other

## 2011-02-11 ENCOUNTER — Inpatient Hospital Stay (HOSPITAL_COMMUNITY)
Admission: EM | Admit: 2011-02-11 | Discharge: 2011-02-13 | DRG: 057 | Disposition: A | Discharge status: Home / Self Care | Payer: Medicaid Other | Attending: Neurology | Admitting: Neurology

## 2011-02-11 ENCOUNTER — Encounter (HOSPITAL_COMMUNITY): Payer: Self-pay | Admitting: Emergency Medicine

## 2011-02-11 ENCOUNTER — Inpatient Hospital Stay (HOSPITAL_COMMUNITY): Payer: Medicaid Other

## 2011-02-11 DIAGNOSIS — I6340 Cerebral infarction due to embolism of unspecified cerebral artery: Secondary | ICD-10-CM | POA: Diagnosis present

## 2011-02-11 DIAGNOSIS — F988 Other specified behavioral and emotional disorders with onset usually occurring in childhood and adolescence: Secondary | ICD-10-CM | POA: Diagnosis present

## 2011-02-11 DIAGNOSIS — IMO0002 Reserved for concepts with insufficient information to code with codable children: Secondary | ICD-10-CM

## 2011-02-11 DIAGNOSIS — G8194 Hemiplegia, unspecified affecting left nondominant side: Secondary | ICD-10-CM

## 2011-02-11 DIAGNOSIS — I639 Cerebral infarction, unspecified: Secondary | ICD-10-CM

## 2011-02-11 DIAGNOSIS — F17200 Nicotine dependence, unspecified, uncomplicated: Secondary | ICD-10-CM | POA: Diagnosis present

## 2011-02-11 DIAGNOSIS — K649 Unspecified hemorrhoids: Secondary | ICD-10-CM | POA: Diagnosis present

## 2011-02-11 DIAGNOSIS — G8190 Hemiplegia, unspecified affecting unspecified side: Principal | ICD-10-CM | POA: Diagnosis present

## 2011-02-11 HISTORY — DX: Nicotine dependence, cigarettes, uncomplicated: F17.210

## 2011-02-11 LAB — POCT I-STAT, CHEM 8
Calcium, Ion: 1.12 mmol/L (ref 1.12–1.32)
Chloride: 103 mEq/L (ref 96–112)
HCT: 49 % (ref 39.0–52.0)
Sodium: 142 mEq/L (ref 135–145)
TCO2: 26 mmol/L (ref 0–100)

## 2011-02-11 LAB — URINALYSIS, ROUTINE W REFLEX MICROSCOPIC
Bilirubin Urine: NEGATIVE
Hgb urine dipstick: NEGATIVE
Nitrite: NEGATIVE
Specific Gravity, Urine: 1.019 (ref 1.005–1.030)
Urobilinogen, UA: 0.2 mg/dL (ref 0.0–1.0)
pH: 6.5 (ref 5.0–8.0)

## 2011-02-11 LAB — CBC
Hemoglobin: 16.5 g/dL (ref 13.0–17.0)
Platelets: 121 10*3/uL — ABNORMAL LOW (ref 150–400)
RBC: 5.1 MIL/uL (ref 4.22–5.81)
WBC: 6.5 10*3/uL (ref 4.0–10.5)

## 2011-02-11 LAB — BASIC METABOLIC PANEL
BUN: 8 mg/dL (ref 6–23)
Chloride: 103 mEq/L (ref 96–112)
GFR calc Af Amer: >90 mL/min (ref >90)
Glucose, Bld: 129 mg/dL — ABNORMAL HIGH (ref 70–99)
Potassium: 3.2 mEq/L — ABNORMAL LOW (ref 3.5–5.1)
Sodium: 140 mEq/L (ref 135–145)

## 2011-02-11 LAB — RAPID URINE DRUG SCREEN, HOSP PERFORMED
Barbiturates: NOT DETECTED
Benzodiazepines: POSITIVE — AB

## 2011-02-11 LAB — DIFFERENTIAL
Lymphs Abs: 2.6 10*3/uL (ref 0.7–4.0)
Monocytes Relative: 12 % (ref 3–12)
Neutro Abs: 3.2 10*3/uL (ref 1.7–7.7)
Neutrophils Relative %: 49 % (ref 43–77)

## 2011-02-11 LAB — PROTIME-INR
INR: 1.03 (ref 0.00–1.49)
Prothrombin Time: 13.7 seconds (ref 11.6–15.2)

## 2011-02-11 LAB — APTT: aPTT: 28 seconds (ref 24–37)

## 2011-02-11 MED ORDER — ACETAMINOPHEN 650 MG RE SUPP: 650.0000 mg | RECTAL | Status: DC | PRN

## 2011-02-11 MED ORDER — ONDANSETRON HCL 4 MG/2ML IJ SOLN: 4.0000 mg | Freq: Four times a day (QID) | INTRAMUSCULAR | Status: DC | PRN

## 2011-02-11 MED ORDER — PANTOPRAZOLE SODIUM 40 MG IV SOLR
40.0000 mg | Freq: Every day | INTRAVENOUS | Status: DC
  Administered 2011-02-11 – 2011-02-12 (×2): 40 mg via INTRAVENOUS
  Filled 2011-02-11 (×3): qty 40

## 2011-02-11 MED ORDER — ALPRAZOLAM 0.5 MG PO TABS
0.5000 mg | ORAL_TABLET | Freq: Once | ORAL | Status: AC
  Administered 2011-02-11: 0.5 mg via ORAL
  Filled 2011-02-11: qty 1

## 2011-02-11 MED ORDER — CLINDAMYCIN HCL 300 MG PO CAPS
300.0000 mg | ORAL_CAPSULE | Freq: Three times a day (TID) | ORAL | Status: DC
  Administered 2011-02-11 – 2011-02-13 (×5): 300 mg via ORAL
  Filled 2011-02-11 (×8): qty 1

## 2011-02-11 MED ORDER — SENNOSIDES-DOCUSATE SODIUM 8.6-50 MG PO TABS
1.0000 | ORAL_TABLET | Freq: Every evening | ORAL | Status: DC | PRN
  Filled 2011-02-11: qty 1

## 2011-02-11 MED ORDER — ALTEPLASE (STROKE) FULL DOSE INFUSION
0.9000 mg/kg | Freq: Once | INTRAVENOUS | Status: AC
  Administered 2011-02-11: 82 mg via INTRAVENOUS
  Filled 2011-02-11: qty 100

## 2011-02-11 MED ORDER — ACETAMINOPHEN 325 MG PO TABS
650.0000 mg | ORAL_TABLET | ORAL | Status: DC | PRN
  Administered 2011-02-11 – 2011-02-13 (×4): 650 mg via ORAL
  Filled 2011-02-11: qty 2
  Filled 2011-02-11: qty 1
  Filled 2011-02-11 (×2): qty 2

## 2011-02-11 MED ORDER — SODIUM CHLORIDE 0.9 % IV SOLN
INTRAVENOUS | Status: DC
  Administered 2011-02-11: 22:00:00 via INTRAVENOUS
  Administered 2011-02-11: 500 mL via INTRAVENOUS
  Administered 2011-02-12 – 2011-02-13 (×3): via INTRAVENOUS

## 2011-02-11 MED ORDER — ALPRAZOLAM 0.5 MG PO TABS
0.5000 mg | ORAL_TABLET | Freq: Every evening | ORAL | Status: DC | PRN
  Administered 2011-02-11: 0.5 mg via ORAL
  Filled 2011-02-11: qty 1

## 2011-02-11 MED ORDER — LABETALOL HCL 5 MG/ML IV SOLN
10.0000 mg | INTRAVENOUS | Status: DC | PRN
  Filled 2011-02-11: qty 4

## 2011-02-11 MED ORDER — PNEUMOCOCCAL VAC POLYVALENT 25 MCG/0.5ML IJ INJ
0.5000 mL | INJECTION | INTRAMUSCULAR | Status: AC
  Administered 2011-02-12: 0.5 mL via INTRAMUSCULAR
  Filled 2011-02-11: qty 0.5

## 2011-02-11 MED ORDER — OXYCODONE HCL 5 MG PO TABS
10.0000 mg | ORAL_TABLET | Freq: Once | ORAL | Status: AC
  Administered 2011-02-11: 10 mg via ORAL
  Filled 2011-02-11: qty 2

## 2011-02-11 MED ORDER — WITCH HAZEL-GLYCERIN EX PADS
MEDICATED_PAD | CUTANEOUS | Status: DC | PRN
  Administered 2011-02-11 – 2011-02-12 (×3): via TOPICAL
  Filled 2011-02-11 (×2): qty 100

## 2011-02-11 NOTE — ED Notes (Signed)
Pt noted to have blood on jeans - in area of buttocks.  Pt states had BM this morning with pain and thinks he has hemorrhoid.  Dr. Effie Shy notified and evaluated pt.  States pt has hemorrhoid and Dr. Thad Ranger notified.  Bleeding controlled.

## 2011-02-11 NOTE — ED Provider Notes (Addendum)
History     CSN: [id number] Arrival date & time: 02/11/2011  1:55 PM   First MD Initiated Contact with Patient 02/11/11 1402      No chief complaint on file.   (Consider location/radiation/quality/duration/timing/severity/associated sxs/prior treatment) Patient is a 35 y.o. male presenting with weakness. The history is provided by the patient. No language interpreter was used.  Weakness The primary symptoms include headaches, focal weakness and speech change. Primary symptoms do not include syncope, loss of consciousness, dizziness, visual change, paresthesias, loss of sensation, fever, nausea or vomiting. The symptoms began 1 to 2 hours ago. The symptoms are unchanged. The neurological symptoms are focal. The symptoms occurred on exertion.  The headache is associated with weakness. The headache is not associated with photophobia, visual change, neck stiffness or paresthesias.  Weakness began 1 - 3 hours ago. The weakness is unchanged. Region/motion of weakness: left upper and lower ext. There is impairment of the following actions: articulating words.  Change in speech began 1 - 3 hours ago. The speech change is unchanged. Features of the speech change include inability to speak fluently and inability to articulate.  Additional symptoms include weakness. Additional symptoms do not include neck stiffness, photophobia or hallucinations.    Past Medical History  Diagnosis Date  . Attention deficit disorder     Past Surgical History  Procedure Date  . Arm debridement     History reviewed. No pertinent family history.  History  Substance Use Topics  . Smoking status: Current Everyday Smoker -- 1.0 packs/day  . Smokeless tobacco: Not on file  . Alcohol Use: Yes      Review of Systems  Constitutional: Negative for fever, activity change, appetite change and fatigue.  HENT: Negative for congestion, sore throat, rhinorrhea, neck pain and neck stiffness.   Eyes: Negative for  photophobia and visual disturbance.  Respiratory: Negative for cough and shortness of breath.   Cardiovascular: Negative for chest pain, palpitations and syncope.  Gastrointestinal: Negative for nausea, vomiting and abdominal pain.  Genitourinary: Negative for dysuria, urgency, frequency and flank pain.  Neurological: Positive for speech change, focal weakness, speech difficulty, weakness and headaches. Negative for dizziness, loss of consciousness, syncope, light-headedness, numbness and paresthesias.  Psychiatric/Behavioral: Positive for confusion. Negative for hallucinations.  All other systems reviewed and are negative.    Allergies  Review of patient's allergies indicates no known allergies.  Home Medications   Current Outpatient Rx  Name Route Sig Dispense Refill  . ALPRAZOLAM 0.5 MG PO TABS Oral Take 0.5 mg by mouth at bedtime as needed.      Marland Kitchen CLINDAMYCIN HCL 300 MG PO CAPS Oral Take 300 mg by mouth 3 (three) times daily.        BP 152/101  Pulse 100  Resp 18  Ht 5\' 9"  (1.753 m)  Wt 200 lb (90.719 kg)  BMI 29.53 kg/m2  SpO2 100%  Physical Exam  Nursing note and vitals reviewed. Constitutional: He is oriented to person, place, and time. He appears well-developed and well-nourished. He appears distressed.  HENT:  Head: Normocephalic and atraumatic.  Mouth/Throat: Oropharynx is clear and moist.  Eyes: Conjunctivae and EOM are normal. Pupils are equal, round, and reactive to light.  Neck: Normal range of motion. Neck supple.  Cardiovascular: Normal rate, regular rhythm, normal heart sounds and intact distal pulses.  Exam reveals no gallop and no friction rub.   No murmur heard. Pulmonary/Chest: Effort normal and breath sounds normal. No respiratory distress. He exhibits no tenderness.  Abdominal: Soft. Bowel sounds are normal. There is no tenderness.  Neurological: He is alert and oriented to person, place, and time. He has normal strength. He displays no atrophy. No  cranial nerve deficit or sensory deficit. He exhibits normal muscle tone. GCS eye subscore is 4. GCS verbal subscore is 5. GCS motor subscore is 6.  Reflex Scores:      Tricep reflexes are 2+ on the right side and 2+ on the left side.      Bicep reflexes are 2+ on the right side and 2+ on the left side.      Brachioradialis reflexes are 2+ on the right side and 2+ on the left side.      Patellar reflexes are 2+ on the right side and 2+ on the left side.      Achilles reflexes are 2+ on the right side and 2+ on the left side.      He is confused and having difficulty speaking  Skin: Skin is warm and dry. No rash noted.    ED Course  Procedures (including critical care time)  Labs Reviewed  CBC - Abnormal; Notable for the following:    Platelets 121 (*)    All other components within normal limits  BASIC METABOLIC PANEL - Abnormal; Notable for the following:    Potassium 3.2 (*)    Glucose, Bld 129 (*)    All other components within normal limits  POCT I-STAT, CHEM 8 - Abnormal; Notable for the following:    Potassium 3.1 (*)    Glucose, Bld 136 (*)    All other components within normal limits  DIFFERENTIAL  APTT  PROTIME-INR  URINALYSIS, ROUTINE W REFLEX MICROSCOPIC  I-STAT TROPONIN I  URINE RAPID DRUG SCREEN (HOSP PERFORMED)   Ct Head Wo Contrast  02/11/2011  *RADIOLOGY REPORT*  Clinical Data: Left-sided heaviness, facial droop, left arm numbness, slurred speech  CT HEAD WITHOUT CONTRAST  Technique:  Contiguous axial images were obtained from the base of the skull through the vertex without contrast.  Comparison: 01/31/2009  Findings: No evidence of parenchymal hemorrhage or extra-axial fluid collection. No mass lesion, mass effect, or midline shift.  No CT evidence of acute infarction.  Cerebral volume is age appropriate.  No ventriculomegaly.  The visualized paranasal sinuses are essentially clear. The mastoid air cells are unopacified.  No evidence of calvarial fracture.   IMPRESSION: Normal head CT.  Original Report Authenticated By: Charline Bills, M.D.   Dg Chest Port 1 View  02/11/2011  *RADIOLOGY REPORT*  Clinical Data: Code stroke, unresponsive  PORTABLE CHEST - 1 VIEW  Comparison: 10/03/2007  Findings: Cardiac leads overlie the chest.  Heart size is normal. Lung volumes are low but clear.  No pleural effusion.  No acute osseous finding.  IMPRESSION: No focal acute finding.  Original Report Authenticated By: Harrel Lemon, M.D.     1. CVA (cerebral vascular accident)       MDM  Code stroke was activated during medical screening examination. I evaluated the patient in conjunction with neurology. Neurology decided to administer TPA given his symptoms. CT scan was negative. Remainder of the code stroke laboratory studies were obtained in the emergency department prior to admission. He'll be admitted to the neuro ICU for further evaluation and treatment        Dayton Bailiff, MD 02/11/11 1525  Called to bedside as had bleeding hemorrhoid.  Dressed and admitting physician notified  Dayton Bailiff, MD 02/11/11 [phone]

## 2011-02-11 NOTE — H&P (Addendum)
Admission H&P    Chief Complaint: Left sided weakness and difficulty with speech HPI: Daniel Solis is an 35 y.o. male who reports that he was coming out of a store at about 11-12 today and noted heaviness on the right side of his body. Right was numb as well.  Also reports that he was unable to read and study due to the fact that he was confused.  Unable to give time better than 11-12.  Lastly noted pain behind his left knee.  Thought it was a cramp and took a bath but symptoms did not improve.  Felt at that point that he needed to come in for evaluation.  LSN: [id number]  tPA Given: Yes  Past Medical History  Diagnosis Date  . Attention deficit disorder     Past Surgical History  Procedure Date  . Arm debridement     History reviewed. No pertinent family history. Social History:  reports that he has been smoking.  He does not have any smokeless tobacco history on file. He reports that he drinks alcohol. He reports that he does not use illicit drugs.  Allergies: No Known Allergies  Medications Prior to Admission  Medication Dose Route Frequency Provider Last Rate Last Dose  . 0.9 %  sodium chloride infusion   Intravenous Continuous Dayton Bailiff, MD      . alteplase (ACTIVASE) 1 mg/mL infusion 82 mg  0.9 mg/kg Intravenous Once Pola Corn, MD   82 mg at 02/11/11 1450   Medications Prior to Admission  Medication Sig Dispense Refill  . ALPRAZolam (XANAX) 0.5 MG tablet Take 0.5 mg by mouth at bedtime as needed.        Marland Kitchen amphetamine-dextroamphetamine (ADDERALL) 30 MG tablet Take 30 mg by mouth daily.        . clindamycin (CLEOCIN) 300 MG capsule Take 300 mg by mouth 3 (three) times daily.        Marland Kitchen PRESCRIPTION MEDICATION Inject as directed every 7 (seven) days. Pegysis Treatment       . Tetrahydrozoline HCl (VISINE OP) Place 3-4 drops into the right eye once.          ROS: History obtained from the patient  General ROS: negative for - chills, fatigue, fever, night sweats,  weight gain or weight loss Psychological ROS: negative for - behavioral disorder, hallucinations, memory difficulties, mood swings or suicidal ideation Ophthalmic ROS: negative for - blurry vision, double vision, eye pain or loss of vision ENT ROS: negative for - epistaxis, nasal discharge, oral lesions, sore throat, tinnitus or vertigo Allergy and Immunology ROS: negative for - hives or itchy/watery eyes Hematological and Lymphatic ROS: negative for - bleeding problems, bruising or swollen lymph nodes Endocrine ROS: negative for - galactorrhea, hair pattern changes, polydipsia/polyuria or temperature intolerance Respiratory ROS: negative for - cough, hemoptysis, shortness of breath or wheezing Cardiovascular ROS: negative for - chest pain, dyspnea on exertion, edema or irregular heartbeat Gastrointestinal ROS: negative for - abdominal pain, diarrhea, hematemesis, nausea/vomiting or stool incontinence Genito-Urinary ROS: negative for - dysuria, hematuria, incontinence or urinary frequency/urgency Musculoskeletal ROS:as noted in HPI Neurological ROS: as noted in HPI   Physical Examination: Blood pressure 152/101, pulse 100, resp. rate 18, height 5\' 9"  (1.753 m), weight 90.719 kg (200 lb), SpO2 100.00%.  HEENT - Normocephalic, without obvious abnormality, atraumatic, conjunctivae/corneas clear. normal TM's, nose and throat Cardiovascular - regular rate and rhythm, S1, S2 normal, no murmur, click, rub or gallop Lungs - chest clear, no wheezing, rales, normal  symmetric air entry Abdomen - soft, non-tender; bowel sounds normal; no masses,  no organomegaly Extremities - Leg pain behind the left knee to palpation. No edema  Neurologic Examination: Mental Status: Alert, oriented, thought content appropriate.  Speech non- fluent.  Word-finding difficulties noted. Difficulty with reading.  Able to follow 3 step commands but required some reinforcement. Patient anxious. Cranial Nerves: II: visual  fields grossly normal, pupils equal, round, reactive to light and accommodation III,IV, VI: ptosis not present, extraocular muscles extra-ocular motions intact bilaterally V,VII: smile symmetric, decreased facial sensation to light touch on the left VIII: hearing normal bilaterally IX,X: gag reflex present XI: trapezius strength/neck flexion strength normal bilaterally XII: tongue strength normal  Motor: Right : Upper extremity   5/5    Left:     Upper extremity   5-/5 with drift  Lower extremity   5/5     Lower extremity   5-/5 Tone and bulk:normal tone throughout; no atrophy noted Sensory: Pinprick and light touch decreased on the left Deep Tendon Reflexes: 2+ and symmetric throughout Plantars: Right: downgoing   Left: downgoing Cerebellar: normal finger-to-nose and normal heel-to-shin testing on the right.  Dysmetria noted with both the upper and lower extremity on the left   Results for orders placed during the hospital encounter of 02/11/11 (from the past 48 hour(s))  CBC     Status: Abnormal   Collection Time   02/11/11  2:27 PM      Component Value Range Comment   WBC 6.5  4.0 - 10.5 (K/uL)    RBC 5.10  4.22 - 5.81 (MIL/uL)    Hemoglobin 16.5  13.0 - 17.0 (g/dL)    HCT 16.1  09.6 - 04.5 (%)    MCV 91.4  78.0 - 100.0 (fL)    MCH 32.4  26.0 - 34.0 (pg)    MCHC 35.4  30.0 - 36.0 (g/dL)    RDW 40.9  81.1 - 91.4 (%)    Platelets 121 (*) 150 - 400 (K/uL)   DIFFERENTIAL     Status: Normal   Collection Time   02/11/11  2:27 PM      Component Value Range Comment   Neutrophils Relative 49  43 - 77 (%)    Neutro Abs 3.2  1.7 - 7.7 (K/uL)    Lymphocytes Relative 39  12 - 46 (%)    Lymphs Abs 2.6  0.7 - 4.0 (K/uL)    Monocytes Relative 12  3 - 12 (%)    Monocytes Absolute 0.8  0.1 - 1.0 (K/uL)    Eosinophils Relative 0  0 - 5 (%)    Eosinophils Absolute 0.0  0.0 - 0.7 (K/uL)    Basophils Relative 0  0 - 1 (%)    Basophils Absolute 0.0  0.0 - 0.1 (K/uL)   PROTIME-INR      Status: Normal   Collection Time   02/11/11  2:27 PM      Component Value Range Comment   Prothrombin Time 13.7  11.6 - 15.2 (seconds)    INR 1.03  0.00 - 1.49    POCT I-STAT, CHEM 8     Status: Abnormal   Collection Time   02/11/11  2:28 PM      Component Value Range Comment   Sodium 142  135 - 145 (mEq/L)    Potassium 3.1 (*) 3.5 - 5.1 (mEq/L)    Chloride 103  96 - 112 (mEq/L)    BUN 7  6 - 23 (  mg/dL)    Creatinine, Ser 8.11  0.50 - 1.35 (mg/dL)    Glucose, Bld 914 (*) 70 - 99 (mg/dL)    Calcium, Ion 7.82  1.12 - 1.32 (mmol/L)    TCO2 26  0 - 100 (mmol/L)    Hemoglobin 16.7  13.0 - 17.0 (g/dL)    HCT 95.6  21.3 - 08.6 (%)    Ct Head Wo Contrast  02/11/2011  *RADIOLOGY REPORT*  Clinical Data: Left-sided heaviness, facial droop, left arm numbness, slurred speech  CT HEAD WITHOUT CONTRAST  Technique:  Contiguous axial images were obtained from the base of the skull through the vertex without contrast.  Comparison: 01/31/2009  Findings: No evidence of parenchymal hemorrhage or extra-axial fluid collection. No mass lesion, mass effect, or midline shift.  No CT evidence of acute infarction.  Cerebral volume is age appropriate.  No ventriculomegaly.  The visualized paranasal sinuses are essentially clear. The mastoid air cells are unopacified.  No evidence of calvarial fracture.  IMPRESSION: Normal head CT.  Original Report Authenticated By: Charline Bills, M.D.    Assessment: 35 y.o. male presenting with speech difficulties and right sided numbness and weakness.  Initial NIHSS was 4.  Patient without contraindications for tPA.  Treatment discussed with patient and verbal consent obtained.  Full dose tPA was administered.  Stroke Risk Factors - smoking  Plan: 1. HgbA1c, fasting lipid panel 2. MRI, MRA  of the brain without contrast 3. PT consult, OT consult, Speech consult 4. Echocardiogram 5. Carotid dopplers 6. Prophylactic therapy-Antiplatelet med: Aspirin - dose to be started  after 24 hours 7. Risk factor modification 8. Admit to 3100  Thana Farr, MD [facility] [phone] 02/11/2011, 2:59 PM  This patient is critically ill and at significant risk of neurological worsening, death and care requires constant monitoring of vital signs, hemodynamics,respiratory and cardiac monitoring, neurological assessment, discussion with family, other specialists and medical decision making of high complexity. I spent 90 minutes of neurocritical care time  in the care of  this patient.  Thana Farr, MD [facility] [phone] 02/11/2011  6:53 PM

## 2011-02-11 NOTE — ED Notes (Addendum)
Report given to [facility], [place] on 3100.  Informed that pt did not have stroke swallow screen completed since he has just finished TPA (TPA finished at 1350) and is at risk for bleeding.

## 2011-02-11 NOTE — ED Provider Notes (Addendum)
Medical Screening examination:  Patient presents with acute onset left arm and left leg weakness and numbness.  He reports this started abruptly.  He reports his left arm feeling "heavy".  He's never had symptoms like this.  He has no headache.  His had no difficulty speaking facial droop.  No prior history of stroke or heart attack.  No early family history of stroke.  He denies significant medical problems  Physical exam: Patient with left arm and left leg weakness as compared to right his left arm and his left leg or 4/5 strength.  He has no pronator drift.  His finger to nose on his left is abnormal.   Code stroke called at 2:01 PM  Lyanne Co, MD 02/11/11 1402  Lyanne Co, MD 02/11/11 2252

## 2011-02-11 NOTE — ED Notes (Addendum)
Pt states was walking around at home around 1100 this morning when he began feeling heavy on left side.

## 2011-02-12 ENCOUNTER — Inpatient Hospital Stay (HOSPITAL_COMMUNITY): Payer: Medicaid Other

## 2011-02-12 LAB — LIPID PANEL
LDL Cholesterol: 91 mg/dL (ref 0–99)
Triglycerides: 127 mg/dL (ref <150)

## 2011-02-12 LAB — HEMOGLOBIN A1C: Hgb A1c MFr Bld: 5.3 % (ref <5.7)

## 2011-02-12 MED ORDER — OXYCODONE HCL 5 MG PO TABS
10.0000 mg | ORAL_TABLET | Freq: Once | ORAL | Status: AC
  Administered 2011-02-12: 10 mg via ORAL
  Filled 2011-02-12: qty 2

## 2011-02-12 MED ORDER — NICOTINE 14 MG/24HR TD PT24
14.0000 mg | MEDICATED_PATCH | Freq: Every day | TRANSDERMAL | Status: DC
  Administered 2011-02-12 – 2011-02-13 (×2): 14 mg via TRANSDERMAL
  Filled 2011-02-12 (×2): qty 1

## 2011-02-12 MED ORDER — ALPRAZOLAM 0.5 MG PO TABS
1.0000 mg | ORAL_TABLET | Freq: Three times a day (TID) | ORAL | Status: DC
  Administered 2011-02-12 – 2011-02-13 (×4): 1 mg via ORAL
  Filled 2011-02-12: qty 2
  Filled 2011-02-12 (×2): qty 1
  Filled 2011-02-12 (×2): qty 2

## 2011-02-12 MED ORDER — LORAZEPAM 2 MG/ML IJ SOLN
2.0000 mg | Freq: Once | INTRAMUSCULAR | Status: AC
  Administered 2011-02-12: 2 mg via INTRAVENOUS
  Filled 2011-02-12: qty 1

## 2011-02-12 NOTE — Progress Notes (Signed)
*  PRELIMINARY RESULTS* Echocardiogram 2D Echocardiogram has been performed.  Glean Salen [facility] 02/12/2011, 12:19 PM

## 2011-02-12 NOTE — Progress Notes (Signed)
*  PRELIMINARY RESULTS*   Carotid Dopplers have been performed.  No significant ICA stenosis.  Vertebral artery flow is antegrade.  Sherren Kerns Renee 02/12/2011, 4:39 PM

## 2011-02-12 NOTE — Progress Notes (Signed)
Subjective: The patient reports some sensation of left-sided heaviness and weakness. The patient indicates that he is feeling much better today than he was on admission. The patient indicated that he was having difficulty thinking and producing language. Patient indicates that this type of episode has never occurred before. There is no prior history of migraine, although the patient does report headache at this time.  Objective: Vital signs in last 24 hours: Temp:  [97.7 F (36.5 C)-98.4 F (36.9 C)] 97.7 F (36.5 C) (11/18 0400) Pulse Rate:  [68-100] 73  (11/18 0632) Resp:  [12-24] 16  (11/18 0632) BP: (115-162)/(73-116) 132/90 mmHg (11/18 0632) SpO2:  [95 %-100 %] 97 % (11/18 1610) Weight:  [90.7 kg (199 lb 15.3 oz)-90.719 kg (200 lb)] 199 lb 15.3 oz (90.7 kg) (11/18 9604) Weight change:  Last BM Date: 02/11/11  Intake/Output from previous day: 11/17 0701 - 11/18 0700 In: 1870 [P.O.:120; I.V.:1750] Out: 300 [Urine:300] Intake/Output this shift:   Respiratory examination is clear bilaterally. Cardiac vascular examination reveals a regular rate and rhythm, no obvious murmurs or rubs are noted.  Abdomen reveals positive bowel sounds, no tenderness is noted.  There is no evidence of peripheral edema.  The patient is alert and cooperative.  Neurologic exam reveals full extraocular movements, speech is normal. Visual fields are full.  Motor testing reveals good strength of all four extremities.  The patient has good finger-nose-finger and heel-to-shin bilaterally. Gait was not tested.  Deep tendon reflexes are symmetric and normal. Toes are down going bilaterally.    Lab Results:  Basename 02/11/11 1428 02/11/11 1427  WBC -- 6.5  HGB 16.7 16.5  HCT 49.0 46.6  PLT -- 121*   BMET  Basename 02/11/11 1428 02/11/11 1427  NA 142 140  K 3.1* 3.2*  CL 103 103  CO2 -- 26  GLUCOSE 136* 129*  BUN 7 8  CREATININE 0.90 0.79  CALCIUM -- 8.8    Studies/Results: Ct Head Wo  Contrast  02/11/2011  *RADIOLOGY REPORT*  Clinical Data: Left-sided heaviness, facial droop, left arm numbness, slurred speech  CT HEAD WITHOUT CONTRAST  Technique:  Contiguous axial images were obtained from the base of the skull through the vertex without contrast.  Comparison: 01/31/2009  Findings: No evidence of parenchymal hemorrhage or extra-axial fluid collection. No mass lesion, mass effect, or midline shift.  No CT evidence of acute infarction.  Cerebral volume is age appropriate.  No ventriculomegaly.  The visualized paranasal sinuses are essentially clear. The mastoid air cells are unopacified.  No evidence of calvarial fracture.  IMPRESSION: Normal head CT.  Original Report Authenticated By: Charline Bills, M.D.   Dg Chest Port 1 View  02/11/2011  *RADIOLOGY REPORT*  Clinical Data: Code stroke, unresponsive  PORTABLE CHEST - 1 VIEW  Comparison: 10/03/2007  Findings: Cardiac leads overlie the chest.  Heart size is normal. Lung volumes are low but clear.  No pleural effusion.  No acute osseous finding.  IMPRESSION: No focal acute finding.  Original Report Authenticated By: Harrel Lemon, M.D.    Medications:  Scheduled:   . ALPRAZolam  0.5 mg Oral Once  . ALPRAZolam  1 mg Oral TID  . alteplase  0.9 mg/kg Intravenous Once  . clindamycin  300 mg Oral TID  . LORazepam  2 mg Intravenous Once  . oxyCODONE  10 mg Oral Once  . oxyCODONE  10 mg Oral Once  . pantoprazole (PROTONIX) IV  40 mg Intravenous QHS  . pneumococcal 23 valent vaccine  0.5 mL Intramuscular Tomorrow-1000   Continuous:   . sodium chloride 125 mL/hr at 02/12/11 0600   ZOX:[id number], acetaminophen, labetalol, ondansetron (ZOFRAN) IV, senna-docusate, witch hazel-glycerin, DISCONTD: ALPRAZolam  Assessment/Plan: 1. Left-sided weakness and heaviness  2. Anxiety disorder  3. Attention deficit disorder  The patient currently has no clinical deficits. The patient got full dose TPA, and will undergo a MRI of  the brain later this evening. The patient will engage in a full stroke Workup with a carotid Doppler study and 2-D echocardiogram. The patient will be placed on aspirin therapy after the 24-hour period following TPA he has relapsed. Plan to transfer the patient to the floor in the morning. Prognosis for this patient should be excellent.  1. HgbA1c, fasting lipid panel  2. MRI, MRA of the brain without contrast  3. PT consult, OT consult, Speech consult  4. Echocardiogram  5. Carotid dopplers  6. Prophylactic therapy-Antiplatelet med: Aspirin - dose to be started after 24 hours  7. Risk factor modification   LOS: 1 day   Daniel Solis KEITH 02/12/2011, 8:36 AM

## 2011-02-12 NOTE — Progress Notes (Signed)
Physical Therapy Evaluation Patient Details Name: Daniel Solis MRN: [id number] DOB: 14-Apr-1975 Today's Date: 02/12/2011  Problem List:  Patient Active Problem List  Diagnoses  . Cerebral embolism with cerebral infarction  . Hemiplegia and hemiparesis  . Aphasia due to stroke    Past Medical History:  Past Medical History  Diagnosis Date  . Attention deficit disorder    Past Surgical History:  Past Surgical History  Procedure Date  . Arm debridement     PT Assessment/Plan/Recommendation Patient is being discharged from PT services secondary to no acute PT needs identified.  See note below. Discharge plan discussed with patient/caregiver and they agree.  PT Assessment Clinical Impression Statement: Patient is 35 yo male admitted with right CVA - given tPA.  Note no residual effects of CVA except "numbness" of left LE - not impacting mobility.  Patient with no acute PT needs.  PT will sign off. PT Recommendation/Assessment: Patent does not need any further PT services No Skilled PT: Patient is independent with all acitivity/mobility PT Goals   None  PT Evaluation Precautions/Restrictions   None Prior Functioning  Home Living Lives With: Spouse Type of Home: House Home Layout: Two level;Bed/bath upstairs Alternate Level Stairs-Rails: Right Alternate Level Stairs-Number of Steps: flight Bathroom Toilet: Standard Home Adaptive Equipment: None Prior Function Level of Independence: Independent with basic ADLs;Independent with homemaking with ambulation;Independent with gait Driving: Yes Vocation: Full time employment Cognition Cognition Arousal/Alertness: Awake/alert Overall Cognitive Status: Appears within functional limits for tasks assessed (Patient impulsive at times (OOB without assist)) Orientation Level: Oriented X4 Sensation/Coordination Sensation Light Touch: Impaired by gross assessment (Patient reports left leg feels numb.) Coordination Gross Motor  Movements are Fluid and Coordinated: Yes Extremity Assessment RUE Assessment RUE Assessment: Within Functional Limits LUE Assessment LUE Assessment: Within Functional Limits RLE Assessment RLE Assessment: Within Functional Limits LLE Assessment LLE Assessment: Within Functional Limits Mobility (including Balance) Bed Mobility Bed Mobility: Yes Supine to Sit: 7: Independent;HOB elevated (Comment degrees) (HOB at 20 degrees) Supine to Sit Details (indicate cue type and reason): Cues to slow mobility for safety. Sitting - Scoot to Edge of Bed: 7: Independent Transfers Transfers: Yes Sit to Stand: 7: Independent;With upper extremity assist;From bed Stand to Sit: 7: Independent;With upper extremity assist;With armrests;To chair/3-in-1 Ambulation/Gait Ambulation/Gait: Yes Ambulation/Gait Assistance: 7: Independent Ambulation Distance (Feet): 175 Feet Assistive device: None Gait Pattern: Within Functional Limits Gait velocity: Good gait speed.  Cues to slow down for safety. Stairs: No  Balance Balance Assessed: Yes High Level Balance High Level Balance Activites: Backward walking;Direction changes;Turns;Sudden stops;Head turns (Patient performed independently without loss of balance)   End of Session PT - End of Session Equipment Utilized During Treatment: Gait belt Activity Tolerance: Patient tolerated treatment well Patient left: in chair;with call bell in reach;with family/visitor present Nurse Communication: Mobility status for ambulation General Behavior During Session: [facility] for tasks performed Cognition: [facility] for tasks performed (Impulsive - cues to slow down.)  Vena Austria [phone] 02/12/2011, 8:32 PM

## 2011-02-13 ENCOUNTER — Encounter (HOSPITAL_COMMUNITY): Payer: Self-pay | Admitting: Nurse Practitioner

## 2011-02-13 DIAGNOSIS — F17200 Nicotine dependence, unspecified, uncomplicated: Secondary | ICD-10-CM | POA: Diagnosis present

## 2011-02-13 DIAGNOSIS — G8194 Hemiplegia, unspecified affecting left nondominant side: Secondary | ICD-10-CM

## 2011-02-13 MED ORDER — ASPIRIN EC 81 MG PO TBEC
81.0000 mg | DELAYED_RELEASE_TABLET | Freq: Every day | ORAL | Status: DC
  Administered 2011-02-13: 81 mg via ORAL
  Filled 2011-02-13: qty 1

## 2011-02-13 MED ORDER — NICOTINE 14 MG/24HR TD PT24: 1.0000 | MEDICATED_PATCH | Freq: Every day | TRANSDERMAL | Status: AC

## 2011-02-13 NOTE — Progress Notes (Signed)
CSW received referral and met with pt. For assessment please see Clinical Social Work Brief Psychosocial Assessment in pt's chart. CSW is signed off as no further clinical social work needs identified.   Dede Query, MSW, Theresia Majors  [phone]

## 2011-02-13 NOTE — Progress Notes (Signed)
Patient at baseline level of functioning with mild weakness noted on LUE. Educated patient on continued (normal) use of LUE. No visual deficits noted. Patient donned pants and shirt independently with no LOB. No further acute OT need indicated- signing off.   Glendale Chard    OTR/L Pager: [phone] 02/13/2011

## 2011-02-13 NOTE — Progress Notes (Signed)
Stroke Team Progress Note  SUBJECTIVE  Daniel Solis is a 35 y.o. male who presents for an event occurred 1 day ago. He has residual symptoms of cognitive impairment, inability to speak and weakness of left side with incoordination. Overall he feels his condition is resolved except for sensory deficit on the left. Stroke risk factors include: smoking and etoh use.   OBJECTIVE Most recent Vital Signs: Temp: 98.2 F (36.8 C) (11/19 0400) Temp src: Oral (11/19 0400) BP: 132/90 mmHg (11/19 0700) Pulse Rate: 72  (11/19 0700) Respiratory Rate: 20 O2 Saturdation: 95%  CBG (last 3)  No results found for this basename: GLUCAP:3 in the last 72 hours Intake/Output from previous day: 11/18 0701 - 11/19 0700 In: 3708 [P.O.:958; I.V.:2750] Out: 3300 [Urine:3300]  IV Fluid Intake:     . sodium chloride 125 mL/hr at 02/13/11 9147   Diet:  Cardiac thin liquids  Activity:  Up with assistance  DVT Prophylaxis:  SCDs   Studies: CBC    Component Value Date/Time   WBC 6.5 02/11/2011 1427   RBC 5.10 02/11/2011 1427   HGB 16.7 02/11/2011 1428   HCT 49.0 02/11/2011 1428   PLT 121* 02/11/2011 1427   MCV 91.4 02/11/2011 1427   MCH 32.4 02/11/2011 1427   MCHC 35.4 02/11/2011 1427   RDW 12.8 02/11/2011 1427   LYMPHSABS 2.6 02/11/2011 1427   MONOABS 0.8 02/11/2011 1427   EOSABS 0.0 02/11/2011 1427   BASOSABS 0.0 02/11/2011 1427   CMP    Component Value Date/Time   NA 142 02/11/2011 1428   K 3.1* 02/11/2011 1428   CL 103 02/11/2011 1428   CO2 26 02/11/2011 1427   GLUCOSE 136* 02/11/2011 1428   BUN 7 02/11/2011 1428   CREATININE 0.90 02/11/2011 1428   CALCIUM 8.8 02/11/2011 1427   PROT 7.1 03/23/2009 1745   ALBUMIN 3.9 03/23/2009 1745   AST 203* 03/23/2009 1745   ALT 207* 03/23/2009 1745   ALKPHOS 81 03/23/2009 1745   BILITOT 0.4 03/23/2009 1745   GFRNONAA >90 02/11/2011 1427   GFRAA >90 02/11/2011 1427   COAGS Lab Results  Component Value Date   INR 1.03 02/11/2011   Lipid  Panel    Component Value Date/Time   CHOL 147 02/12/2011 0556   TRIG 127 02/12/2011 0556   HDL 31* 02/12/2011 0556   CHOLHDL 4.7 02/12/2011 0556   VLDL 25 02/12/2011 0556   LDLCALC 91 02/12/2011 0556   HgbA1C  Lab Results  Component Value Date   HGBA1C 5.3 02/12/2011   Urine Drug Screen     Component Value Date/Time   LABOPIA NONE DETECTED 02/11/2011 2056   COCAINSCRNUR NONE DETECTED 02/11/2011 2056   LABBENZ POSITIVE* 02/11/2011 2056   AMPHETMU POSITIVE* 02/11/2011 2056   THCU NONE DETECTED 02/11/2011 2056   LABBARB NONE DETECTED 02/11/2011 2056    Alcohol Level    Component Value Date/Time   ETH  Value: 321        LOWEST DETECTABLE LIMIT FOR SERUM ALCOHOL IS 5 mg/dL FOR MEDICAL PURPOSES ONLY* 10/24/2009 1550   CXR:  No focal acute finding.    CT of the brain: no acute abnormalities  MRI of the brain: negative  MRA of the brain: negative  2D Echocardiogram: done, results pending  Carotid Doppler:  No right or left internal carotid artery stenosis. Vertebrals with antegrade flow bilaterally.  EKG:  not performed.   Physical Exam:  Respiratory examination is clear bilaterally. Cardiac vascular examination reveals a regular rate  and rhythm, no obvious murmurs or rubs are noted.  Abdomen reveals positive bowel sounds, no tenderness is noted.  There is no evidence of peripheral edema.  The patient is alert and cooperative.  Neurologic exam reveals full extraocular movements, speech is normal. Visual fields are full.  Motor testing reveals good strength of all four extremities.  The patient has good finger-nose-finger and heel-to-shin bilaterally. Gait was not tested.  Deep tendon reflexes are symmetric and normal. Toes are down going bilaterally.  ASSESSMENT [name] is a 35 y.o. male  Who presented with sudden onset of left-sided weakness, clumsiness distal mentation and speech difficulties. He was given IV TPA without any complications. He has remained  stable with blood pressure of tightly controlled. He has had near total improvement with only mild left-sided numbness remaining. He has no family history of strokes or heart attacks at a young age. He admits to significant stress in his life which may have controverted to his symptoms. 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 Stroke risk factors:  smoking and etoh use.  Hospital day # 2  TREATMENT/PLAN Plan discharge later today.mobilize out of bed. Physical and occupational therapy have already seen him and signed off. I've counseled the patient to quit smoking and alcohol. I was advising to participate in stress relaxation activities like  exercise, medication, swimming or yoga. Await 2D echo results.   Joaquin Music, ANP-BC, GNP-BC [facility] Pager: [phone] 02/13/2011 7:47 AM  Dr. Delia Heady, Stroke Center Medical Director, has personally reviewed chart, pertinent data, examined the patient and developed the plan of care.

## 2011-02-13 NOTE — Progress Notes (Signed)
Pt. being discharged home with discharge instructions. Vital signs stable, PIVs removed. NP to bedside prior to discharge.

## 2011-02-13 NOTE — Discharge Summary (Signed)
Stroke Discharge Summary  Patient ID: Daniel Solis   MRN: [id number]      DOB: Sep 30, 1975  Date of Admission: 02/11/2011 Date of Discharge: 02/13/2011  Attending Physician:  Darcella Cheshire, MD  Patient's PCP:  [facility]  Discharge Diagnoses:  Principal Problem:  *Left hemiparesis and hemiplegia status post full dose IV TPA  Past Medical History  Diagnosis Date  . Attention deficit disorder   . Cigarette smoker    Past Surgical History  Procedure Date  . Arm debridement     BMI: Body mass index is 29.53 kg/(m^2).   Current Discharge Medication List    START taking these medications   Details  nicotine (NICODERM CQ - DOSED IN MG/24 HOURS) 14 mg/24hr patch Place 1 patch onto the skin daily. Qty: 28 patch, Refills: 3      CONTINUE these medications which have NOT CHANGED   Details  ALPRAZolam (XANAX) 0.5 MG tablet Take 0.5 mg by mouth 3 (three) times daily as needed. For anxiety     amphetamine-dextroamphetamine (ADDERALL XR) 30 MG 24 hr capsule Take 30 mg by mouth every morning.      Significant Diagnostic Studies:  Ct Head Wo Contrast -  Normal  MRI Brain Wo Contrast -  No significant abnormality. MRA HEAD  Wo Contrast - Negative  CXR - no focal acute finding 2D Echocardiogram:  Completed, results pending at time of discharge Carotid Doppler:  no ICA stenosis bilaterally, bilateral vertebral flow antegrade   EKG: can find no evidence it was performed, telemetry shows NSR  Laboratory Studies: CBC    Component Value Date/Time   WBC 6.5 02/11/2011 1427   RBC 5.10 02/11/2011 1427   HGB 16.7 02/11/2011 1428   HCT 49.0 02/11/2011 1428   PLT 121* 02/11/2011 1427   MCV 91.4 02/11/2011 1427   MCH 32.4 02/11/2011 1427   MCHC 35.4 02/11/2011 1427   RDW 12.8 02/11/2011 1427   LYMPHSABS 2.6 02/11/2011 1427   MONOABS 0.8 02/11/2011 1427   EOSABS 0.0 02/11/2011 1427   BASOSABS 0.0 02/11/2011 1427   CMP    Component Value Date/Time   NA  142 02/11/2011 1428   K 3.1* 02/11/2011 1428   CL 103 02/11/2011 1428   CO2 26 02/11/2011 1427   GLUCOSE 136* 02/11/2011 1428   BUN 7 02/11/2011 1428   CREATININE 0.90 02/11/2011 1428   CALCIUM 8.8 02/11/2011 1427   PROT 7.1 03/23/2009 1745   ALBUMIN 3.9 03/23/2009 1745   AST 203* 03/23/2009 1745   ALT 207* 03/23/2009 1745   ALKPHOS 81 03/23/2009 1745   BILITOT 0.4 03/23/2009 1745   GFRNONAA >90 02/11/2011 1427   GFRAA >90 02/11/2011 1427   COAGS Lab Results  Component Value Date   INR 1.03 02/11/2011   Lipid Panel    Component Value Date/Time   CHOL 147 02/12/2011 0556   TRIG 127 02/12/2011 0556   HDL 31* 02/12/2011 0556   CHOLHDL 4.7 02/12/2011 0556   VLDL 25 02/12/2011 0556   LDLCALC 91 02/12/2011 0556   HgbA1C  Lab Results  Component Value Date   HGBA1C 5.3 02/12/2011   Urine Drug Screen     Component Value Date/Time   LABOPIA NONE DETECTED 02/11/2011 2056   COCAINSCRNUR NONE DETECTED 02/11/2011 2056   LABBENZ POSITIVE* 02/11/2011 2056   AMPHETMU POSITIVE* 02/11/2011 2056   THCU NONE DETECTED 02/11/2011 2056   LABBARB NONE DETECTED 02/11/2011 2056    Alcohol Level  Component Value Date/Time   ETH  Value: 321        LOWEST DETECTABLE LIMIT FOR SERUM ALCOHOL IS 5 mg/dL FOR MEDICAL PURPOSES ONLY* 10/24/2009 1550    History of Present Illness:  Daniel Solis is an 35 y.o. male who reports that he was coming out of a store at about 11-12 today and noted heaviness on the right side of his body. Right was numb as well. Also reports that he was unable to read and study due to the fact that he was confused. Unable to give time better than 11-12. Lastly noted pain behind his left knee. Thought it was a cramp and took a bath but symptoms did not improve. Felt at that point that he needed to come in for evaluation. In the ED his NIHSS was 4. As patient  Was without contraindications, he was administered IV TPA and admitted to the neuro ICU.   Hospital Course: Patient  tolerated TPA without difficulty. Post TPA imaging showed no hemorrhage. MRI was negative for acute stroke. Left hemiparesis present on admission totally resolved in hospital. He had no identified therapy needs. Pt reports recent high levels of stress. Patient was encouraged to find stress-relieving activities. No antiplatelets are recommended.  He has stroke risk factors of:  smoking and obesity. He was placed on nicotine patch in hsopital and advised to stop smoking. He was encouraged to lose weight.  Discharged Condition: good  Discharge Exam:  Blood pressure 126/90, pulse 77, temperature 98.5 F (36.9 C), temperature source Oral, resp. rate 11, height 5\' 9"  (1.753 m), weight 90.7 kg (199 lb 15.3 oz), SpO2 100.00%. Respiratory examination is clear bilaterally. Cardiac vascular examination reveals a regular rate and rhythm, no obvious murmurs or rubs are noted.  Abdomen reveals positive bowel sounds, no tenderness is noted.  There is no evidence of peripheral edema.  The patient is alert and cooperative.  Neurologic exam reveals full extraocular movements, speech is normal. Visual fields are full.  Motor testing reveals good strength of all four extremities.  The patient has good finger-nose-finger and heel-to-shin bilaterally. Gait was not tested.  Deep tendon reflexes are symmetric and normal. Toes are down going bilaterally.  Discharge Diet:  Cardiac thin liquids  Discharge Plan: - Disposition:  Home or Self Care - Ongoing stroke risk factor control by Primary Care Physician. - Follow-up [facility] in 1 month. - Follow-up with Dr. Delia Heady in 2 months.  Signed: Annie Main, Tama Gander, [facility] Nurse Practitioner 02/13/2011, 2:47 PM  Dr. Delia Heady, Stroke Center Medical Director, has personally reviewed chart, pertinent data, examined the patient and developed the plan of care.

## 2011-02-13 NOTE — Progress Notes (Signed)
Speech Language/Pathology Speech Language Pathology Evaluation Patient Details Name: Daniel Solis MRN: [id number] DOB: 16-Nov-1975 Today's Date: 02/13/2011  Problem List:  Patient Active Problem List  Diagnoses  . Cerebral embolism with cerebral infarction  . Hemiplegia and hemiparesis  . Aphasia due to stroke    Past Medical History:  Past Medical History  Diagnosis Date  . Attention deficit disorder    Past Surgical History:  Past Surgical History  Procedure Date  . Arm debridement     SLP Assessment/Plan/Recommendation Assessment Clinical Impression Statement: Pt with no cognitive linguisitc deficits.  No f/u needed. SLP Recommendation/Assessment: Patent does not need any further Speech Lanaguage Pathology Services No Skilled Speech Therapy: All education completed;Patient at baseline level of functioning;Patient is independent with all cognitive/linguistic skills Individuals Consulted Consulted and Agree with Results and Recommendations: Patient;Family member/caregiver Family Member Consulted : wife  Harlon Ditty, [place] CCC-SLP [phone]  Claudine Mouton 02/13/2011, 11:34 AM

## 2011-02-15 ENCOUNTER — Encounter (HOSPITAL_COMMUNITY): Payer: Self-pay | Admitting: *Deleted

## 2011-09-19 ENCOUNTER — Encounter (HOSPITAL_COMMUNITY): Payer: Self-pay | Admitting: Emergency Medicine

## 2011-09-19 ENCOUNTER — Emergency Department (HOSPITAL_COMMUNITY): Payer: Medicaid Other

## 2011-09-19 ENCOUNTER — Emergency Department (HOSPITAL_COMMUNITY)
Admission: EM | Admit: 2011-09-19 | Discharge: 2011-09-19 | Disposition: A | Discharge status: Home / Self Care | Payer: Medicaid Other | Attending: Emergency Medicine | Admitting: Emergency Medicine

## 2011-09-19 DIAGNOSIS — R079 Chest pain, unspecified: Secondary | ICD-10-CM

## 2011-09-19 DIAGNOSIS — Z8673 Personal history of transient ischemic attack (TIA), and cerebral infarction without residual deficits: Secondary | ICD-10-CM | POA: Insufficient documentation

## 2011-09-19 DIAGNOSIS — F17200 Nicotine dependence, unspecified, uncomplicated: Secondary | ICD-10-CM | POA: Insufficient documentation

## 2011-09-19 DIAGNOSIS — R071 Chest pain on breathing: Secondary | ICD-10-CM | POA: Insufficient documentation

## 2011-09-19 HISTORY — DX: Transient cerebral ischemic attack, unspecified: G45.9

## 2011-09-19 LAB — COMPREHENSIVE METABOLIC PANEL
Albumin: 3.3 g/dL — ABNORMAL LOW (ref 3.5–5.2)
Alkaline Phosphatase: 76 U/L (ref 39–117)
BUN: 19 mg/dL (ref 6–23)
Potassium: 3.5 mEq/L (ref 3.5–5.1)
Sodium: 137 mEq/L (ref 135–145)
Total Protein: 6.2 g/dL (ref 6.0–8.3)

## 2011-09-19 LAB — CBC
Hemoglobin: 18.4 g/dL — ABNORMAL HIGH (ref 13.0–17.0)
RBC: 6.09 MIL/uL — ABNORMAL HIGH (ref 4.22–5.81)
WBC: 8.3 10*3/uL (ref 4.0–10.5)

## 2011-09-19 LAB — PROTIME-INR
INR: 0.92 (ref 0.00–1.49)
Prothrombin Time: 12.6 seconds (ref 11.6–15.2)

## 2011-09-19 LAB — APTT: aPTT: 29 seconds (ref 24–37)

## 2011-09-19 MED ORDER — IOHEXOL 350 MG/ML SOLN
100.0000 mL | Freq: Once | INTRAVENOUS | Status: AC | PRN
  Administered 2011-09-19: 100 mL via INTRAVENOUS

## 2011-09-19 MED ORDER — NAPROXEN 500 MG PO TABS: 500.0000 mg | ORAL_TABLET | Freq: Two times a day (BID) | ORAL | Status: AC

## 2011-09-19 MED ORDER — TRAMADOL HCL 50 MG PO TABS: 50.0000 mg | ORAL_TABLET | Freq: Four times a day (QID) | ORAL | Status: DC | PRN

## 2011-09-19 MED ORDER — ONDANSETRON 8 MG PO TBDP: 8.0000 mg | ORAL_TABLET | Freq: Three times a day (TID) | ORAL | Status: AC | PRN

## 2011-09-19 MED ORDER — HYDROCODONE-ACETAMINOPHEN 5-325 MG PO TABS: 1.0000 | ORAL_TABLET | Freq: Four times a day (QID) | ORAL | Status: AC | PRN

## 2011-09-19 MED ORDER — MORPHINE SULFATE 4 MG/ML IJ SOLN
4.0000 mg | Freq: Once | INTRAMUSCULAR | Status: AC
  Administered 2011-09-19: 4 mg via INTRAVENOUS
  Filled 2011-09-19: qty 1

## 2011-09-19 MED ORDER — NICOTINE 14 MG/24HR TD PT24
14.0000 mg | MEDICATED_PATCH | Freq: Once | TRANSDERMAL | Status: DC
  Administered 2011-09-19: 14 mg via TRANSDERMAL

## 2011-09-19 MED ORDER — SODIUM CHLORIDE 0.9 % IV SOLN
1000.0000 mL | INTRAVENOUS | Status: DC
  Administered 2011-09-19 (×2): 1000 mL via INTRAVENOUS

## 2011-09-19 MED ORDER — NICOTINE 21 MG/24HR TD PT24
MEDICATED_PATCH | TRANSDERMAL | Status: AC
  Filled 2011-09-19: qty 1

## 2011-09-19 NOTE — ED Notes (Signed)
Patient transported to X-ray 

## 2011-09-19 NOTE — ED Notes (Signed)
Pt presents with c/o pain on the left side of his chest   Pt states it started 4 days ago and has progressively gotten worse  Pt states he has an appt with his PCP this am but the pain was so bad he could not wait  Pt states it hurts worse to try to take a deep breath  Pt denies injury at this time

## 2011-09-19 NOTE — ED Notes (Signed)
Pt moved back to hall bed due to constructions

## 2011-09-19 NOTE — Discharge Instructions (Signed)
Chest Pain (Nonspecific) It is often hard to give a specific diagnosis for the cause of chest pain. There is always a chance that your pain could be related to something serious, such as a heart attack or a blood clot in the lungs. You need to follow up with your caregiver for further evaluation. CAUSES   Heartburn.   Pneumonia or bronchitis.   Anxiety or stress.   Inflammation around your heart (pericarditis) or lung (pleuritis or pleurisy).   A blood clot in the lung.   A collapsed lung (pneumothorax). It can develop suddenly on its own (spontaneous pneumothorax) or from injury (trauma) to the chest.   Shingles infection (herpes zoster virus).  The chest wall is composed of bones, muscles, and cartilage. Any of these can be the source of the pain.  The bones can be bruised by injury.   The muscles or cartilage can be strained by coughing or overwork.   The cartilage can be affected by inflammation and become sore (costochondritis).  DIAGNOSIS  Lab tests or other studies, such as X-rays, electrocardiography, stress testing, or cardiac imaging, may be needed to find the cause of your pain.  TREATMENT   Treatment depends on what may be causing your chest pain. Treatment may include:   Acid blockers for heartburn.   Anti-inflammatory medicine.   Pain medicine for inflammatory conditions.   Antibiotics if an infection is present.   You may be advised to change lifestyle habits. This includes stopping smoking and avoiding alcohol, caffeine, and chocolate.   You may be advised to keep your head raised (elevated) when sleeping. This reduces the chance of acid going backward from your stomach into your esophagus.   Most of the time, nonspecific chest pain will improve within 2 to 3 days with rest and mild pain medicine.  HOME CARE INSTRUCTIONS   If antibiotics were prescribed, take your antibiotics as directed. Finish them even if you start to feel better.   For the next few  days, avoid physical activities that bring on chest pain. Continue physical activities as directed.   Do not smoke.   Avoid drinking alcohol.   Only take over-the-counter or prescription medicine for pain, discomfort, or fever as directed by your caregiver.   Follow your caregiver's suggestions for further testing if your chest pain does not go away.   Keep any follow-up appointments you made. If you do not go to an appointment, you could develop lasting (chronic) problems with pain. If there is any problem keeping an appointment, you must call to reschedule.  SEEK MEDICAL CARE IF:   You think you are having problems from the medicine you are taking. Read your medicine instructions carefully.   Your chest pain does not go away, even after treatment.   You develop a rash with blisters on your chest.  SEEK IMMEDIATE MEDICAL CARE IF:   You have increased chest pain or pain that spreads to your arm, neck, jaw, back, or abdomen.   You develop shortness of breath, an increasing cough, or you are coughing up blood.   You have severe back or abdominal pain, feel nauseous, or vomit.   You develop severe weakness, fainting, or chills.   You have a fever.  THIS IS AN EMERGENCY. Do not wait to see if the pain will go away. Get medical help at once. Call your local emergency services (911 in [place] Do not drive yourself to the hospital. MAKE SURE YOU:   Understand these instructions.     Will watch your condition.   Will get help right away if you are not doing well or get worse.  Document Released: 12/21/2004 Document Revised: 03/02/2011 Document Reviewed: 10/17/2007 [facility] Patient Information 2012 [place], [place].Pleurisy Pleurisy is an inflammation and swelling of the lining of the lungs. It usually is the result of an underlying infection or other disease. Because of this inflammation, it hurts to breathe. It is aggravated by coughing or deep breathing. The primary goal in treating  pleurisy is to diagnose and treat the condition that caused it.  HOME CARE INSTRUCTIONS   Only take over-the-counter or prescription medicines for pain, discomfort, or fever as directed by your caregiver.   If medications which kill germs (antibiotics) were prescribed, take the entire course. Even if you are feeling better, you need to take them.   Use a cool mist vaporizer to help loosen secretions. This is so the secretions can be coughed up more easily.  SEEK MEDICAL CARE IF:   Your pain is not controlled with medication or is increasing.   You have an increase inpus like (purulent) secretions brought up with coughing.  SEEK IMMEDIATE MEDICAL CARE IF:   You have blue or dark lips, fingernails, or toenails.   You begin coughing up blood.   You have increased difficulty breathing.   You have continuing pain unrelieved by medicine or lasting more than 1 week.   You have pain that radiates into your neck, arms, or jaw.   You develop increased shortness of breath or wheezing.   You develop a fever, rash, vomiting, fainting, or other serious complaints.  Document Released: 03/13/2005 Document Revised: 03/02/2011 Document Reviewed: 10/12/2006 [facility] Patient Information 2012 [place], [place].

## 2011-09-19 NOTE — ED Notes (Signed)
Pt ambulated to CT with CT tech. dph

## 2011-09-19 NOTE — ED Provider Notes (Signed)
History     CSN: [id number]  Arrival date & time 09/19/11  7829  First MD Initiated Contact with Patient 09/19/11 [phone]      Chief Complaint  Patient presents with  . chest wall pain     HPI Patient presents to the emergency room with complaints of left-sided chest pain. It began 4 days ago and has been progressively getting worse. The pain is sharp. It increases with deep breathing. He denies fever, coughing, recent trips or travel. The pain is very severe now and he cannot find a comfortable position. Patient had plans to follow up with his primary Dr. but could not wait for his appointment this morning due to the severity of his symptoms. He has history of" stretched heart valves".  The patient states he was told this when he was younger and was told it was related to his living style of heavy alcohol use at a young age. Patient states he has cut down on that behavior. He denies any cocaine use. Past Medical History  Diagnosis Date  . Attention deficit disorder   . Cigarette smoker   . TIA (transient ischemic attack)     Past Surgical History  Procedure Date  . Arm debridement     Family History  Problem Relation Age of Onset  . Cancer Other     History  Substance Use Topics  . Smoking status: Current Everyday Smoker -- 1.0 packs/day    Types: Cigarettes  . Smokeless tobacco: Not on file  . Alcohol Use: Yes      Review of Systems  All other systems reviewed and are negative.    Allergies  Review of patient's allergies indicates no known allergies.  Home Medications   Current Outpatient Rx  Name Route Sig Dispense Refill  . ALPRAZOLAM 0.5 MG PO TABS Oral Take 0.5 mg by mouth 3 (three) times daily as needed. For anxiety     . AMPHETAMINE-DEXTROAMPHET ER 30 MG PO CP24 Oral Take 30 mg by mouth every morning.        BP 130/102  Pulse 94  Resp 24  SpO2 98%  Physical Exam  Nursing note and vitals reviewed. Constitutional: He appears well-developed and  well-nourished. He appears distressed.  HENT:  Head: Normocephalic and atraumatic.  Right Ear: External ear normal.  Left Ear: External ear normal.  Eyes: Conjunctivae are normal. Right eye exhibits no discharge. Left eye exhibits no discharge. No scleral icterus.  Neck: Neck supple. No tracheal deviation present.  Cardiovascular: Normal rate, regular rhythm and intact distal pulses.   Pulmonary/Chest: Effort normal and breath sounds normal. No accessory muscle usage or stridor. Not tachypneic. No respiratory distress. He has no decreased breath sounds. He has no wheezes. He has no rales.  Abdominal: Soft. Bowel sounds are normal. He exhibits no distension. There is no tenderness. There is no rebound and no guarding.  Musculoskeletal: He exhibits no edema and no tenderness.  Neurological: He is alert. He has normal strength. No sensory deficit. Cranial nerve deficit:  no gross defecits noted. He exhibits normal muscle tone. He displays no seizure activity. Coordination normal.  Skin: Skin is warm and dry. No rash noted.  Psychiatric: He has a normal mood and affect.    ED Course  Procedures (including critical care time)  Rate: 87  Rhythm: normal sinus rhythm  QRS Axis: normal  Intervals: normal  ST/T Wave abnormalities: normal  Conduction Disutrbances:none  Old EKG Reviewed: no significant changes noted  Labs Reviewed  CBC - Abnormal; Notable for the following:    RBC 6.09 (*)     Hemoglobin 18.4 (*)     All other components within normal limits  COMPREHENSIVE METABOLIC PANEL - Abnormal; Notable for the following:    Glucose, Bld 105 (*)     Calcium 8.0 (*)     Albumin 3.3 (*)     All other components within normal limits  PROTIME-INR  APTT   Dg Chest 2 View  09/19/2011  *RADIOLOGY REPORT*  Clinical Data: Left chest pain  CHEST - 2 VIEW  Comparison: 02/11/2011  Findings: Lungs are essentially clear. No pleural effusion or pneumothorax.  Stable left hilar prominence,  reflecting normal vasculature when correlating with prior CT.  Heart is normal in size.  Visualized osseous structures are within normal limits.  IMPRESSION: No evidence of acute cardiopulmonary disease.  Original Report Authenticated By: Charline Bills, M.D.   Ct Angio Chest W/cm &/or Wo Cm  09/19/2011  *RADIOLOGY REPORT*  Clinical Data: Sharp left chest pain, evaluate for PE  CT ANGIOGRAPHY CHEST  Technique:  Multidetector CT imaging of the chest using the standard protocol during bolus administration of intravenous contrast. Multiplanar reconstructed images including MIPs were obtained and reviewed to evaluate the vascular anatomy.  Contrast: OMNIPAQUE IOHEXOL 350 MG/ML SOLN  Comparison: Chest radiographs dated 09/19/2011.  CT chest dated 03/09/2005.  Findings: No evidence of pulmonary embolism.  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  Linear scarring versus atelectasis at the lung bases. No pleural effusion or pneumothorax.  Visualized thyroid is unremarkable.  The heart is normal in size.  No pericardial effusion.  No suspicious mediastinal, hilar, or axillary lymphadenopathy.  Visualized upper abdomen is unremarkable.  Visualized osseous structures are within normal limits.  IMPRESSION: No evidence of pulmonary embolism.  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  Original Report Authenticated By: Charline Bills, M.D.     1. Chest pain       MDM  The patient's chest CT does not show any evidence of pneumothorax, pneumonia, aortic dissection or pulmonary embolism. His sharp pleuritic chest pain may be related to pleurisy. The CT scan shows possible respiratory bronchiolitis interstitial lung disease. Patient be discharged home to followup with a primary care        Celene Kras, MD 09/19/11 1126

## 2012-11-07 ENCOUNTER — Emergency Department (HOSPITAL_BASED_OUTPATIENT_CLINIC_OR_DEPARTMENT_OTHER)
Admission: EM | Admit: 2012-11-07 | Discharge: 2012-11-07 | Disposition: A | Discharge status: Home / Self Care | Payer: Medicaid Other | Attending: Emergency Medicine | Admitting: Emergency Medicine

## 2012-11-07 ENCOUNTER — Encounter (HOSPITAL_BASED_OUTPATIENT_CLINIC_OR_DEPARTMENT_OTHER): Payer: Self-pay

## 2012-11-07 DIAGNOSIS — M545 Low back pain, unspecified: Secondary | ICD-10-CM

## 2012-11-07 DIAGNOSIS — Y9389 Activity, other specified: Secondary | ICD-10-CM | POA: Insufficient documentation

## 2012-11-07 DIAGNOSIS — X500XXA Overexertion from strenuous movement or load, initial encounter: Secondary | ICD-10-CM | POA: Insufficient documentation

## 2012-11-07 DIAGNOSIS — IMO0002 Reserved for concepts with insufficient information to code with codable children: Secondary | ICD-10-CM | POA: Insufficient documentation

## 2012-11-07 DIAGNOSIS — Z8673 Personal history of transient ischemic attack (TIA), and cerebral infarction without residual deficits: Secondary | ICD-10-CM | POA: Insufficient documentation

## 2012-11-07 DIAGNOSIS — F411 Generalized anxiety disorder: Secondary | ICD-10-CM | POA: Insufficient documentation

## 2012-11-07 DIAGNOSIS — F17200 Nicotine dependence, unspecified, uncomplicated: Secondary | ICD-10-CM | POA: Insufficient documentation

## 2012-11-07 DIAGNOSIS — Y929 Unspecified place or not applicable: Secondary | ICD-10-CM | POA: Insufficient documentation

## 2012-11-07 DIAGNOSIS — F988 Other specified behavioral and emotional disorders with onset usually occurring in childhood and adolescence: Secondary | ICD-10-CM | POA: Insufficient documentation

## 2012-11-07 HISTORY — DX: Anxiety disorder, unspecified: F41.9

## 2012-11-07 MED ORDER — PREDNISONE 20 MG PO TABS: 60.0000 mg | ORAL_TABLET | Freq: Every day | ORAL | Status: DC

## 2012-11-07 MED ORDER — OXYCODONE-ACETAMINOPHEN 5-325 MG PO TABS: 1.0000 | ORAL_TABLET | Freq: Four times a day (QID) | ORAL | Status: DC | PRN

## 2012-11-07 MED ORDER — KETOROLAC TROMETHAMINE 60 MG/2ML IM SOLN
60.0000 mg | Freq: Once | INTRAMUSCULAR | Status: AC
Start: 2012-11-07 — End: 2012-11-07
  Administered 2012-11-07: 60 mg via INTRAMUSCULAR
  Filled 2012-11-07: qty 2

## 2012-11-07 MED ORDER — DIAZEPAM 5 MG PO TABS: 5.0000 mg | ORAL_TABLET | Freq: Three times a day (TID) | ORAL | Status: DC | PRN

## 2012-11-07 MED ORDER — HYDROMORPHONE HCL PF 2 MG/ML IJ SOLN
2.0000 mg | Freq: Once | INTRAMUSCULAR | Status: AC
  Administered 2012-11-07: 2 mg via INTRAMUSCULAR
  Filled 2012-11-07: qty 1

## 2012-11-07 NOTE — ED Provider Notes (Signed)
CSN: [id number]     Arrival date & time 11/07/12  4098 History     First MD Initiated Contact with Patient 11/07/12 [phone]     Chief Complaint  Patient presents with  . Back Pain   (Consider location/radiation/quality/duration/timing/severity/associated sxs/prior Treatment) Patient is a 37 y.o. male presenting with back pain.  Back Pain  Pt reports 2 days ago while lifting a heavy lawn mower onto a truck he had sudden onset of severe aching mid and right lower back pain, radiating into his R leg, unable to find a comfortable position. He initially had a pain that moved around his thoracic region but that quickly resolved and has not returned. HE denies any incontinence. Able to walk but with difficulty.   Past Medical History  Diagnosis Date  . Attention deficit disorder   . Cigarette smoker   . TIA (transient ischemic attack)   . Anxiety    Past Surgical History  Procedure Laterality Date  . Arm debridement     Family History  Problem Relation Age of Onset  . Cancer Other    History  Substance Use Topics  . Smoking status: Current Every Day Smoker -- 1.00 packs/day    Types: Cigarettes  . Smokeless tobacco: Not on file  . Alcohol Use: No    Review of Systems  Musculoskeletal: Positive for back pain.   All other systems reviewed and are negative except as noted in HPI.   Allergies  Review of patient's allergies indicates no known allergies.  Home Medications   Current Outpatient Rx  Name  Route  Sig  Dispense  Refill  . ALPRAZolam (XANAX) 0.5 MG tablet   Oral   Take 0.5 mg by mouth 3 (three) times daily as needed. For anxiety          . amphetamine-dextroamphetamine (ADDERALL XR) 30 MG 24 hr capsule   Oral   Take 30 mg by mouth every morning.            BP 156/104  Pulse 92  Temp(Src) 97.7 F (36.5 C) (Oral)  Resp 16  Ht 5\' 10"  (1.778 m)  Wt 203 lb (92.08 kg)  BMI 29.13 kg/m2  SpO2 100% Physical Exam  Nursing note and vitals  reviewed. Constitutional: He is oriented to person, place, and time. He appears well-developed and well-nourished.  HENT:  Head: Normocephalic and atraumatic.  Eyes: EOM are normal. Pupils are equal, round, and reactive to light.  Neck: Normal range of motion. Neck supple.  Cardiovascular: Normal rate, normal heart sounds and intact distal pulses.   Pulmonary/Chest: Effort normal and breath sounds normal.  Abdominal: Bowel sounds are normal. He exhibits no distension. There is no tenderness.  Musculoskeletal: Normal range of motion. He exhibits tenderness (R lumbar paraspinal and sciatic notch tenderness). He exhibits no edema.  Neurological: He is alert and oriented to person, place, and time. He has normal strength. He displays normal reflexes. No cranial nerve deficit or sensory deficit.  Skin: Skin is warm and dry. No rash noted.  Psychiatric: He has a normal mood and affect.    ED Course   Procedures (including critical care time)  Labs Reviewed - No data to display No results found. 1. Low back pain     MDM  Low back pain muscle strain vs radiculopathy. IM meds here. Plan PCP followup for further eval and imaging if needed.   8:49 AM Pt feeling better. States it will be more than a week before he can  be seen at PCP. Will scheduled outpatient MRI to be done in the meantime with results to be sent to his PCP. Discharge with percocet, valium and short course of prednisone.   Charles B. Bernette Mayers, MD 11/07/12 [phone]

## 2012-11-07 NOTE — ED Notes (Signed)
Low back pain that started Tuesday after lifting a lawn mower.

## 2012-11-07 NOTE — ED Notes (Signed)
Pt updated on plan of care, radiology is making apt for pt to have an outpatient mri this Saturday.

## 2012-11-08 NOTE — ED Notes (Signed)
Helen with radiology called and sts pt MRI has been declined via Medicaid and needs authorization to cancel or how to proceed. Pt chart reviewed by Dr. Silverio Lay and sts MRI outpt should be cancelled and pt should f/u with PCP.

## 2012-11-09 ENCOUNTER — Ambulatory Visit (HOSPITAL_BASED_OUTPATIENT_CLINIC_OR_DEPARTMENT_OTHER): Payer: Medicaid Other

## 2013-04-10 ENCOUNTER — Emergency Department (HOSPITAL_COMMUNITY)
Admission: EM | Admit: 2013-04-10 | Discharge: 2013-04-10 | Discharge status: Against Medical Advice | Payer: Medicaid Other | Attending: Emergency Medicine | Admitting: Emergency Medicine

## 2013-04-10 ENCOUNTER — Emergency Department (HOSPITAL_COMMUNITY): Payer: Medicaid Other

## 2013-04-10 ENCOUNTER — Encounter (HOSPITAL_COMMUNITY): Payer: Self-pay | Admitting: Emergency Medicine

## 2013-04-10 DIAGNOSIS — Z8673 Personal history of transient ischemic attack (TIA), and cerebral infarction without residual deficits: Secondary | ICD-10-CM | POA: Insufficient documentation

## 2013-04-10 DIAGNOSIS — Z9119 Patient's noncompliance with other medical treatment and regimen: Secondary | ICD-10-CM

## 2013-04-10 DIAGNOSIS — F988 Other specified behavioral and emotional disorders with onset usually occurring in childhood and adolescence: Secondary | ICD-10-CM | POA: Insufficient documentation

## 2013-04-10 DIAGNOSIS — F411 Generalized anxiety disorder: Secondary | ICD-10-CM | POA: Insufficient documentation

## 2013-04-10 DIAGNOSIS — H538 Other visual disturbances: Secondary | ICD-10-CM | POA: Insufficient documentation

## 2013-04-10 DIAGNOSIS — F17200 Nicotine dependence, unspecified, uncomplicated: Secondary | ICD-10-CM | POA: Insufficient documentation

## 2013-04-10 DIAGNOSIS — R1031 Right lower quadrant pain: Secondary | ICD-10-CM | POA: Insufficient documentation

## 2013-04-10 DIAGNOSIS — Z79899 Other long term (current) drug therapy: Secondary | ICD-10-CM | POA: Insufficient documentation

## 2013-04-10 DIAGNOSIS — Z5320 Procedure and treatment not carried out because of patient's decision for unspecified reasons: Secondary | ICD-10-CM

## 2013-04-10 LAB — COMPREHENSIVE METABOLIC PANEL
ALBUMIN: 4.1 g/dL (ref 3.5–5.2)
ALK PHOS: 58 U/L (ref 39–117)
ALT: 33 U/L (ref 0–53)
AST: 34 U/L (ref 0–37)
BUN: 14 mg/dL (ref 6–23)
CALCIUM: 8.9 mg/dL (ref 8.4–10.5)
CO2: 24 mEq/L (ref 19–32)
Chloride: 95 mEq/L — ABNORMAL LOW (ref 96–112)
Creatinine, Ser: 0.85 mg/dL (ref 0.50–1.35)
GFR calc Af Amer: >90 mL/min (ref >90)
GFR calc non Af Amer: >90 mL/min (ref >90)
Glucose, Bld: 91 mg/dL (ref 70–99)
POTASSIUM: 3.9 meq/L (ref 3.7–5.3)
SODIUM: 135 meq/L — AB (ref 137–147)
TOTAL PROTEIN: 7.1 g/dL (ref 6.0–8.3)
Total Bilirubin: 1.1 mg/dL (ref 0.3–1.2)

## 2013-04-10 LAB — CBC WITH DIFFERENTIAL/PLATELET
BASOS ABS: 0.1 10*3/uL (ref 0.0–0.1)
BASOS PCT: 1 % (ref 0–1)
EOS ABS: 0 10*3/uL (ref 0.0–0.7)
Eosinophils Relative: 0 % (ref 0–5)
HCT: 47.1 % (ref 39.0–52.0)
Hemoglobin: 16 g/dL (ref 13.0–17.0)
LYMPHS ABS: 3.3 10*3/uL (ref 0.7–4.0)
Lymphocytes Relative: 33 % (ref 12–46)
MCH: 30 pg (ref 26.0–34.0)
MCHC: 34 g/dL (ref 30.0–36.0)
MCV: 88.2 fL (ref 78.0–100.0)
Monocytes Absolute: 1.1 10*3/uL — ABNORMAL HIGH (ref 0.1–1.0)
Monocytes Relative: 11 % (ref 3–12)
NEUTROS PCT: 56 % (ref 43–77)
Neutro Abs: 5.6 10*3/uL (ref 1.7–7.7)
PLATELETS: 121 10*3/uL — AB (ref 150–400)
RBC: 5.34 MIL/uL (ref 4.22–5.81)
RDW: 12.7 % (ref 11.5–15.5)
WBC: 10 10*3/uL (ref 4.0–10.5)

## 2013-04-10 MED ORDER — MORPHINE SULFATE 4 MG/ML IJ SOLN
4.0000 mg | Freq: Once | INTRAMUSCULAR | Status: DC
  Filled 2013-04-10: qty 1

## 2013-04-10 MED ORDER — IOHEXOL 300 MG/ML  SOLN
50.0000 mL | Freq: Once | INTRAMUSCULAR | Status: AC | PRN
  Administered 2013-04-10: 50 mL via INTRAVENOUS

## 2013-04-10 NOTE — ED Notes (Signed)
Pt c/o abd pain and burning x 3-4 months; feels like blurred vision and feels like is stuttering due to elevated bp

## 2013-04-10 NOTE — ED Provider Notes (Signed)
CSN: [id number]     Arrival date & time 04/10/13  1102 History   First MD Initiated Contact with Patient 04/10/13 1112     Chief Complaint  Patient presents with  . Abdominal Pain   (Consider location/radiation/quality/duration/timing/severity/associated sxs/prior Treatment) Patient is a 38 y.o. male presenting with abdominal pain. The history is provided by the patient. No language interpreter was used.  Abdominal Pain Pain location:  RLQ Pain quality: aching and sharp   Pain radiates to:  Does not radiate Pain severity:  Moderate Onset quality:  Unable to specify Duration: 3-4 months. Timing:  Constant Progression:  Worsening Chronicity:  New Context: not previous surgeries, not recent illness, not recent travel, not sick contacts, not suspicious food intake and not trauma   Relieved by:  Nothing Worsened by:  Nothing tried Ineffective treatments:  None tried Associated symptoms: no anorexia, no chest pain, no chills, no constipation, no cough, no diarrhea, no dysuria, no fatigue, no fever, no hematuria, no nausea, no shortness of breath and no vomiting   Associated symptoms comment:  Intermittent blurry vision described as "clouding" Risk factors: has not had multiple surgeries, not obese and no recent hospitalization     Past Medical History  Diagnosis Date  . Attention deficit disorder   . Cigarette smoker   . TIA (transient ischemic attack)   . Anxiety    Past Surgical History  Procedure Laterality Date  . Arm debridement     Family History  Problem Relation Age of Onset  . Cancer Other    History  Substance Use Topics  . Smoking status: Current Every Day Smoker -- 1.00 packs/day    Types: Cigarettes  . Smokeless tobacco: Not on file  . Alcohol Use: No    Review of Systems  Constitutional: Negative for fever, chills, activity change, appetite change and fatigue.  HENT: Negative for congestion, facial swelling, rhinorrhea and trouble swallowing.   Eyes:  Positive for visual disturbance. Negative for photophobia and pain.  Respiratory: Negative for cough, chest tightness and shortness of breath.   Cardiovascular: Negative for chest pain and leg swelling.  Gastrointestinal: Positive for abdominal pain. Negative for nausea, vomiting, diarrhea, constipation and anorexia.  Endocrine: Negative for polydipsia and polyuria.  Genitourinary: Negative for dysuria, urgency, hematuria, decreased urine volume and difficulty urinating.  Musculoskeletal: Negative for back pain and gait problem.  Skin: Negative for color change, rash and wound.  Allergic/Immunologic: Negative for immunocompromised state.  Neurological: Negative for dizziness, facial asymmetry, speech difficulty, weakness, numbness and headaches.  Psychiatric/Behavioral: Negative for confusion, decreased concentration and agitation.    Allergies  Review of patient's allergies indicates no known allergies.  Home Medications   Current Outpatient Rx  Name  Route  Sig  Dispense  Refill  . ALPRAZolam (XANAX) 0.5 MG tablet   Oral   Take 0.5 mg by mouth 3 (three) times daily as needed for anxiety. For anxiety         . amphetamine-dextroamphetamine (ADDERALL) 30 MG tablet   Oral   Take 30 mg by mouth 2 (two) times daily.         . GuaiFENesin (MUCINEX PO)   Oral   Take 1 tablet by mouth daily as needed (congestion).          BP 170/109  Pulse 98  Temp(Src) 98 F (36.7 C)  Resp 20  SpO2 98% Physical Exam  Constitutional: He is oriented to person, place, and time. He appears well-developed and well-nourished. No distress.  HENT:  Head: Normocephalic and atraumatic.  Mouth/Throat: No oropharyngeal exudate.  Eyes: Pupils are equal, round, and reactive to light.  Neck: Normal range of motion. Neck supple.  Cardiovascular: Normal rate, regular rhythm and normal heart sounds.  Exam reveals no gallop and no friction rub.   No murmur heard. Pulmonary/Chest: Effort normal and  breath sounds normal. No respiratory distress. He has no wheezes. He has no rales.  Abdominal: Soft. Bowel sounds are normal. He exhibits no distension and no mass. There is tenderness in the right lower quadrant. There is no rebound and no guarding.  Musculoskeletal: Normal range of motion. He exhibits no edema and no tenderness.  Neurological: He is alert and oriented to person, place, and time. He has normal strength. He displays no tremor. No cranial nerve deficit or sensory deficit. He exhibits normal muscle tone. He displays a negative Romberg sign. Coordination and gait normal. GCS eye subscore is 4. GCS verbal subscore is 5. GCS motor subscore is 6.  Skin: Skin is warm and dry.  Psychiatric: He has a normal mood and affect.    ED Course  Procedures (including critical care time) Labs Review Labs Reviewed  CBC WITH DIFFERENTIAL - Abnormal; Notable for the following:    Platelets 121 (*)    Monocytes Absolute 1.1 (*)    All other components within normal limits  COMPREHENSIVE METABOLIC PANEL - Abnormal; Notable for the following:    Sodium 135 (*)    Chloride 95 (*)    All other components within normal limits  URINE CULTURE   Imaging Review No results found.  EKG Interpretation   None       MDM   1. RLQ abdominal pain   2. Left before treatment completed    Pt is a 38 y.o. male with Pmhx as above who presents with 3-4 months of constant RLQ pain without associated, aggravating or alleviating symptoms. Pt states he didn't want to go to the doctor because he was scare of what it might be. He also states he has had a lot of work related stress recently, has been having 3-4 months of intermittent blurry vision.  On PE, Pt hypertensive, anxious, but in NAD.  Cardiopulm, neuro exam benign. +ttp RLQ w/o rebound or guarding. GU exam benign, no hernias.  Plan was for CBC, CMP, UA, then CT ab/pelvis, but pt eloped from department without telling staff prior to CT.  Doubt acute  surgical emergency such as appendicitis given the timing of symptoms.  There are no emergent findings on CBC or CMP to f/u with.          Shanna CiscoMegan E Docherty, MD 04/11/13 [phone]

## 2013-04-10 NOTE — ED Notes (Signed)
Pt not in his room.  Gown found on the stretcher.  Docherty EDP notified

## 2013-04-10 NOTE — ED Notes (Signed)
Pt is not in the room 

## 2013-04-10 NOTE — ED Notes (Signed)
Pt reports RLQ pain intermittently x 6 mos.  Has been getting worse lately.  Pt reports nausea today.  Denies any diarrhea.  Pt denies any urinary sxs at this time.

## 2013-12-05 ENCOUNTER — Emergency Department (HOSPITAL_COMMUNITY)
Admission: EM | Admit: 2013-12-05 | Discharge: 2013-12-05 | Disposition: A | Discharge status: Home / Self Care | Payer: Medicaid Other | Attending: Emergency Medicine | Admitting: Emergency Medicine

## 2013-12-05 ENCOUNTER — Encounter (HOSPITAL_COMMUNITY): Payer: Self-pay | Admitting: Emergency Medicine

## 2013-12-05 ENCOUNTER — Emergency Department (HOSPITAL_COMMUNITY): Payer: Medicaid Other

## 2013-12-05 DIAGNOSIS — M545 Low back pain, unspecified: Secondary | ICD-10-CM | POA: Insufficient documentation

## 2013-12-05 DIAGNOSIS — F17200 Nicotine dependence, unspecified, uncomplicated: Secondary | ICD-10-CM | POA: Diagnosis not present

## 2013-12-05 DIAGNOSIS — F1010 Alcohol abuse, uncomplicated: Secondary | ICD-10-CM | POA: Diagnosis not present

## 2013-12-05 DIAGNOSIS — R1031 Right lower quadrant pain: Secondary | ICD-10-CM | POA: Insufficient documentation

## 2013-12-05 DIAGNOSIS — M549 Dorsalgia, unspecified: Secondary | ICD-10-CM

## 2013-12-05 DIAGNOSIS — F988 Other specified behavioral and emotional disorders with onset usually occurring in childhood and adolescence: Secondary | ICD-10-CM | POA: Insufficient documentation

## 2013-12-05 DIAGNOSIS — Z79899 Other long term (current) drug therapy: Secondary | ICD-10-CM | POA: Diagnosis not present

## 2013-12-05 DIAGNOSIS — F411 Generalized anxiety disorder: Secondary | ICD-10-CM | POA: Diagnosis not present

## 2013-12-05 DIAGNOSIS — R109 Unspecified abdominal pain: Secondary | ICD-10-CM

## 2013-12-05 DIAGNOSIS — Z8673 Personal history of transient ischemic attack (TIA), and cerebral infarction without residual deficits: Secondary | ICD-10-CM | POA: Insufficient documentation

## 2013-12-05 DIAGNOSIS — R1011 Right upper quadrant pain: Secondary | ICD-10-CM | POA: Insufficient documentation

## 2013-12-05 LAB — URINALYSIS, ROUTINE W REFLEX MICROSCOPIC
GLUCOSE, UA: NEGATIVE mg/dL
Hgb urine dipstick: NEGATIVE
KETONES UR: NEGATIVE mg/dL
LEUKOCYTES UA: NEGATIVE
NITRITE: NEGATIVE
PROTEIN: 30 mg/dL — AB
Specific Gravity, Urine: 1.036 — ABNORMAL HIGH (ref 1.005–1.030)
Urobilinogen, UA: 1 mg/dL (ref 0.0–1.0)
pH: 6.5 (ref 5.0–8.0)

## 2013-12-05 LAB — CBC WITH DIFFERENTIAL/PLATELET
BASOS ABS: 0 10*3/uL (ref 0.0–0.1)
BASOS PCT: 0 % (ref 0–1)
EOS PCT: 1 % (ref 0–5)
Eosinophils Absolute: 0.1 10*3/uL (ref 0.0–0.7)
HEMATOCRIT: 50.5 % (ref 39.0–52.0)
HEMOGLOBIN: 17.5 g/dL — AB (ref 13.0–17.0)
Lymphocytes Relative: 32 % (ref 12–46)
Lymphs Abs: 2.2 10*3/uL (ref 0.7–4.0)
MCH: 30.2 pg (ref 26.0–34.0)
MCHC: 34.7 g/dL (ref 30.0–36.0)
MCV: 87.1 fL (ref 78.0–100.0)
MONO ABS: 1 10*3/uL (ref 0.1–1.0)
MONOS PCT: 15 % — AB (ref 3–12)
Neutro Abs: 3.5 10*3/uL (ref 1.7–7.7)
Neutrophils Relative %: 52 % (ref 43–77)
Platelets: 121 10*3/uL — ABNORMAL LOW (ref 150–400)
RBC: 5.8 MIL/uL (ref 4.22–5.81)
RDW: 12.5 % (ref 11.5–15.5)
WBC: 6.8 10*3/uL (ref 4.0–10.5)

## 2013-12-05 LAB — COMPREHENSIVE METABOLIC PANEL
ALBUMIN: 4.4 g/dL (ref 3.5–5.2)
ALT: 30 U/L (ref 0–53)
ANION GAP: 16 — AB (ref 5–15)
AST: 28 U/L (ref 0–37)
Alkaline Phosphatase: 83 U/L (ref 39–117)
BILIRUBIN TOTAL: 0.7 mg/dL (ref 0.3–1.2)
BUN: 20 mg/dL (ref 6–23)
CALCIUM: 9.5 mg/dL (ref 8.4–10.5)
CHLORIDE: 95 meq/L — AB (ref 96–112)
CO2: 27 meq/L (ref 19–32)
CREATININE: 0.98 mg/dL (ref 0.50–1.35)
GFR calc Af Amer: >90 mL/min (ref >90)
Glucose, Bld: 113 mg/dL — ABNORMAL HIGH (ref 70–99)
Potassium: 3.8 mEq/L (ref 3.7–5.3)
Sodium: 138 mEq/L (ref 137–147)
Total Protein: 8.1 g/dL (ref 6.0–8.3)

## 2013-12-05 LAB — URINE MICROSCOPIC-ADD ON

## 2013-12-05 LAB — LIPASE, BLOOD: Lipase: 19 U/L (ref 11–59)

## 2013-12-05 MED ORDER — ONDANSETRON HCL 4 MG/2ML IJ SOLN
4.0000 mg | Freq: Once | INTRAMUSCULAR | Status: AC
  Administered 2013-12-05: 4 mg via INTRAVENOUS
  Filled 2013-12-05: qty 2

## 2013-12-05 MED ORDER — OXYCODONE-ACETAMINOPHEN 5-325 MG PO TABS: 1.0000 | ORAL_TABLET | Freq: Four times a day (QID) | ORAL | Status: DC | PRN

## 2013-12-05 MED ORDER — IOHEXOL 300 MG/ML  SOLN
100.0000 mL | Freq: Once | INTRAMUSCULAR | Status: AC | PRN
  Administered 2013-12-05: 100 mL via INTRAVENOUS

## 2013-12-05 MED ORDER — SODIUM CHLORIDE 0.9 % IV BOLUS (SEPSIS)
1000.0000 mL | INTRAVENOUS | Status: AC
  Administered 2013-12-05: 1000 mL via INTRAVENOUS

## 2013-12-05 MED ORDER — HYDROMORPHONE HCL PF 1 MG/ML IJ SOLN
1.0000 mg | Freq: Once | INTRAMUSCULAR | Status: AC
  Administered 2013-12-05: 1 mg via INTRAVENOUS
  Filled 2013-12-05: qty 1

## 2013-12-05 MED ORDER — IOHEXOL 300 MG/ML  SOLN
50.0000 mL | Freq: Once | INTRAMUSCULAR | Status: AC | PRN
  Administered 2013-12-05: 50 mL via ORAL

## 2013-12-05 MED ORDER — NICOTINE 21 MG/24HR TD PT24
21.0000 mg | MEDICATED_PATCH | TRANSDERMAL | Status: DC
  Administered 2013-12-05: 21 mg via TRANSDERMAL
  Filled 2013-12-05: qty 1
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
## 2013-12-05 NOTE — ED Notes (Signed)
Pt states rt lower abdominal and rt back pain.  States urine flow decreased. Pt states n/v. Unknown fever.

## 2015-06-18 ENCOUNTER — Emergency Department (HOSPITAL_BASED_OUTPATIENT_CLINIC_OR_DEPARTMENT_OTHER): Payer: Medicaid Other

## 2015-06-18 ENCOUNTER — Emergency Department (HOSPITAL_BASED_OUTPATIENT_CLINIC_OR_DEPARTMENT_OTHER)
Admission: EM | Admit: 2015-06-18 | Discharge: 2015-06-18 | Disposition: A | Discharge status: Home / Self Care | Payer: Medicaid Other | Attending: Emergency Medicine | Admitting: Emergency Medicine

## 2015-06-18 ENCOUNTER — Encounter (HOSPITAL_BASED_OUTPATIENT_CLINIC_OR_DEPARTMENT_OTHER): Payer: Self-pay | Admitting: *Deleted

## 2015-06-18 DIAGNOSIS — Y9301 Activity, walking, marching and hiking: Secondary | ICD-10-CM | POA: Diagnosis not present

## 2015-06-18 DIAGNOSIS — Y9289 Other specified places as the place of occurrence of the external cause: Secondary | ICD-10-CM | POA: Diagnosis not present

## 2015-06-18 DIAGNOSIS — W1840XA Slipping, tripping and stumbling without falling, unspecified, initial encounter: Secondary | ICD-10-CM | POA: Insufficient documentation

## 2015-06-18 DIAGNOSIS — S8392XA Sprain of unspecified site of left knee, initial encounter: Secondary | ICD-10-CM

## 2015-06-18 DIAGNOSIS — Y998 Other external cause status: Secondary | ICD-10-CM | POA: Diagnosis not present

## 2015-06-18 DIAGNOSIS — S8992XA Unspecified injury of left lower leg, initial encounter: Secondary | ICD-10-CM | POA: Diagnosis present

## 2015-06-18 DIAGNOSIS — F419 Anxiety disorder, unspecified: Secondary | ICD-10-CM | POA: Insufficient documentation

## 2015-06-18 DIAGNOSIS — F909 Attention-deficit hyperactivity disorder, unspecified type: Secondary | ICD-10-CM | POA: Insufficient documentation

## 2015-06-18 DIAGNOSIS — S76012A Strain of muscle, fascia and tendon of left hip, initial encounter: Secondary | ICD-10-CM

## 2015-06-18 DIAGNOSIS — Z8679 Personal history of other diseases of the circulatory system: Secondary | ICD-10-CM | POA: Insufficient documentation

## 2015-06-18 DIAGNOSIS — F17210 Nicotine dependence, cigarettes, uncomplicated: Secondary | ICD-10-CM | POA: Diagnosis not present

## 2015-06-18 NOTE — ED Notes (Signed)
[facility] [place] [facility] and slipped in water on floor and c/o pain in left knee pain and feels numbness in left thigh and left hip. No other injury.

## 2015-06-18 NOTE — Discharge Instructions (Signed)
Rest. Keep your knee elevated as much as possible.  Ice for 20 minutes every 2 hours while awake for the next 2 days.  Ibuprofen 600 mg every 6 hours as needed for pain.  Follow-up with your primary Dr. if not improving in the next week.   Knee Sprain A knee sprain is a tear in one of the strong, fibrous tissues that connect the bones (ligaments) in your knee. The severity of the sprain depends on how much of the ligament is torn. The tear can be either partial or complete. CAUSES  Often, sprains are a result of a fall or injury. The force of the impact causes the fibers of your ligament to stretch too much. This excess tension causes the fibers of your ligament to tear. SIGNS AND SYMPTOMS  You may have some loss of motion in your knee. Other symptoms include:  Bruising.  Pain in the knee area.  Tenderness of the knee to the touch.  Swelling. DIAGNOSIS  To diagnose a knee sprain, your health care provider will physically examine your knee. Your health care provider may also suggest an X-ray exam of your knee to make sure no bones are broken. TREATMENT  If your ligament is only partially torn, treatment usually involves keeping the knee in a fixed position (immobilization) or bracing your knee for activities that require movement for several weeks. To do this, your health care provider will apply a bandage, cast, or splint to keep your knee from moving and to support your knee during movement until it heals. For a partially torn ligament, the healing process usually takes 4-6 weeks. If your ligament is completely torn, depending on which ligament it is, you may need surgery to reconnect the ligament to the bone or reconstruct it. After surgery, a cast or splint may be applied and will need to stay on your knee for 4-6 weeks while your ligament heals. HOME CARE INSTRUCTIONS  Keep your injured knee elevated to decrease swelling.  To ease pain and swelling, apply ice to the injured  area:  Put ice in a plastic bag.  Place a towel between your skin and the bag.  Leave the ice on for 20 minutes, 2-3 times a day.  Only take medicine for pain as directed by your health care provider.  Do not leave your knee unprotected until pain and stiffness go away (usually 4-6 weeks).  If you have a cast or splint, do not allow it to get wet. If you have been instructed not to remove it, cover it with a plastic bag when you shower or bathe. Do not swim.  Your health care provider may suggest exercises for you to do during your recovery to prevent or limit permanent weakness and stiffness. SEEK IMMEDIATE MEDICAL CARE IF:  Your cast or splint becomes damaged.  Your pain becomes worse.  You have significant pain, swelling, or numbness below the cast or splint. MAKE SURE YOU:  Understand these instructions.  Will watch your condition.  Will get help right away if you are not doing well or get worse.   This information is not intended to replace advice given to you by your health care provider. Make sure you discuss any questions you have with your health care provider.   Document Released: 03/13/2005 Document Revised: 04/03/2014 Document Reviewed: 10/23/2012 Elsevier Interactive Patient Education [employer]2016 [employer] Inc.

## 2015-06-18 NOTE — ED Provider Notes (Signed)
CSN: [id number]     Arrival date & time 06/18/15  0810 History   First MD Initiated Contact with Patient 06/18/15 [phone]     No chief complaint on file.    (Consider location/radiation/quality/duration/timing/severity/associated sxs/prior Treatment) HPI Comments: Patient is a 40 year old male who presents with complaints of left knee and hip pain. He states he was walking in [employer] yesterday when he slipped on a wet floor. He did not actually fall to the ground, but was able to catch himself before falling. He reports feeling a pop in his knee and it has been painful since this time. He developed pain in his left hip as well yesterday evening while lying in bed. He is able to ambulate, however his pain is worse with bearing weight and movement. He presents today because he wants to be "covered in case something happens".  The history is provided by the patient.    Past Medical History  Diagnosis Date  . Attention deficit disorder   . Cigarette smoker   . TIA (transient ischemic attack)   . Anxiety    Past Surgical History  Procedure Laterality Date  . Arm debridement     Family History  Problem Relation Age of Onset  . Cancer Other    Social History  Substance Use Topics  . Smoking status: Current Every Day Smoker -- 1.00 packs/day    Types: Cigarettes  . Smokeless tobacco: None  . Alcohol Use: No    Review of Systems  All other systems reviewed and are negative.     Allergies  Review of patient's allergies indicates no known allergies.  Home Medications   Prior to Admission medications   Medication Sig Start Date End Date Taking? Authorizing Provider  ALPRAZolam Prudy Feeler) 0.5 MG tablet Take 0.5 mg by mouth 3 (three) times daily as needed for anxiety.    Yes Historical Provider, MD  amphetamine-dextroamphetamine (ADDERALL) 30 MG tablet Take 30 mg by mouth daily.    Yes Historical Provider, MD   BP 160/104 mmHg  Pulse 74  Temp(Src) 98.4 F (36.9 C) (Oral)  Resp  18  Ht  (1.778 m)  Wt 236 lb (107.049 kg)  BMI 33.86 kg/m2  SpO2 99% Physical Exam  Constitutional: He is oriented to person, place, and time. He appears well-developed and well-nourished. No distress.  HENT:  Head: Normocephalic and atraumatic.  Neck: Normal range of motion. Neck supple.  Musculoskeletal:  The left hip appears grossly normal. He is ambulatory without difficulty. He has full range of motion with minimal discomfort.  The left knee appears grossly normal with no swelling or effusion. He has full range of motion with no crepitus. The knee is stable to varus and valgus stress and there is a negative anterior and posterior drawer test.  Neurological: He is alert and oriented to person, place, and time.  Skin: Skin is warm and dry. He is not diaphoretic.  Nursing note and vitals reviewed.   ED Course  Procedures (including critical care time) Labs Review Labs Reviewed - No data to display  Imaging Review No results found. I have personally reviewed and evaluated these images and lab results as part of my medical decision-making.    MDM   Final diagnoses:  None    Knee x-rays are unremarkable. This appears to be a sprain of the knee. There is no instability or evidence for internal derangement. His left hip appears grossly normal and he has full range of motion. I see no  indication for x-rays as I am quite certain this is not fractured. He will be discharged with rest, ice, elevation, and when necessary return.    Geoffery Lyonsouglas Idamay Hosein, MD 06/18/15 [phone]

## 2016-02-04 ENCOUNTER — Ambulatory Visit (INDEPENDENT_AMBULATORY_CARE_PROVIDER_SITE_OTHER): Payer: Medicaid Other | Admitting: Neurology

## 2016-02-04 ENCOUNTER — Encounter (INDEPENDENT_AMBULATORY_CARE_PROVIDER_SITE_OTHER): Payer: Self-pay | Admitting: Neurology

## 2016-02-04 DIAGNOSIS — R202 Paresthesia of skin: Secondary | ICD-10-CM | POA: Diagnosis not present

## 2016-02-04 DIAGNOSIS — Z0289 Encounter for other administrative examinations: Secondary | ICD-10-CM

## 2016-02-04 NOTE — Procedures (Signed)
Full Name: Marguarite ArbourRobert Huster Gender: Male MRN #: [id number] Date of Birth: October 12, 1975    Visit Date: 02/04/2016 10:51 Age: [age] Years 2 Months Old Examining Physician: Levert FeinsteinYijun Oluwatobi Ruppe, MD  Referring physician: Dr. 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  On examination: Bilateral lower extremity motor strength is normal, he has decreased light touch pinprick at right lateral thigh above left knee level.  Deep tendon reflex was present and symmetric, including bilateral patellar reflexes.  Findings:  Nerve conduction study: Left peroneal, sural sensory responses were normal.  Left lateral femoral cutaneous nerve sensory response was absent. In comparison, right lateral femoral cutaneous nerve was able to be elicited.  Left tibial, left peroneal to EDB and left peroneal to tibialis anterior was normal.  Electromyography: Selective needle examination of left lower extremity muscles and left lumbosacral paraspinals was normal.  Conclusion:  This is a mild abnormal study, there is no evidence of left lumbar sacral radiculopathy. History and electrodiagnostic study supported diagnosis of left meralgia paresthetica, also known as left lateral femoral cutaneous nerve neuropathy.     ------------------------------- Levert FeinsteinYijun Sayeed Weatherall, M.D.  [facility] [address] [place], [place] [address] Tel: [phone] Fax: [phone]        [facility]    Nerve / Sites Rec. Site Peak Lat Ref.  Amp Ref. Segments Distance    ms ms V V  cm  L Superficial peroneal - Ankle     Lat leg Ankle 3.6 ?4.4 18 ?6 Lat leg - Ankle 14  L Sural - Ankle (Calf)     Calf Ankle 4.2 ?4.4 11 ?6 Calf - Ankle 14  L Lateral femoral cutaneous - Thigh (Inguinal ligament)     A. Ing ligament Thigh NR  NR  A. Ing ligament - Thigh   R Lateral femoral cutaneous - Thigh (Inguinal ligament)     A. Ing ligament Thigh 4.1  4   A. Ing ligament - Thigh              [facility]    Nerve / Sites Muscle Latency Ref. Amplitude Ref. Rel Amp Segments Distance Lat Diff Velocity Ref. Area    ms ms mV mV %  cm ms m/s m/s mVms  L Peroneal - EDB     Ankle EDB 4.4 ?6.5 8.8 ?2.0 100 Ankle - EDB 9    29.1     Fib head EDB 10.7  8.9  101 Fib head - Ankle 32 6.3 51 ?44 29.4     Pop fossa EDB 12.9  8.7  97.8 Pop fossa - Fib head 11 2.2 50 ?44 28.8         Pop fossa - Ankle  8.5     L Peroneal - Tib Ant     Fib Head Tib Ant 4.3 ?6.7 6.5 ?3.0 100 Fib Head - Tib Ant 10    41.0  L Tibial - AH     Ankle AH 3.8 ?5.8 12.5 ?4.0 100 Ankle - AH 9    27.3     Pop fossa AH 12.7  8.8  70.7 Pop fossa - Ankle 38 9.0 42 ?2241            F  Wave    Nerve F Lat Ref.   ms ms  L Peroneal - EDB 46.0 ?56.0  L Tibial - AH 52.3 ?56.0  H Reflex    Nerve H Lat Lat Hmax   ms ms   Left Right Ref. Left Right Ref.  Tibial - Soleus 38.9 38.9 ?35.0 34.4  ?35.0         EMG       EMG Summary Table    Spontaneous MUAP Recruitment  Muscle IA Fib PSW Fasc Other Amp Dur. Poly Pattern  L. Vastus lateralis Normal None None None _______ Normal Normal Normal Normal  L. Biceps femoris (short head) Normal None None None _______ Normal Normal Normal Normal  L. Tibialis posterior Normal None None None _______ Normal Normal Normal Normal  L. Tibialis anterior Normal None None None _______ Normal Normal Normal Normal  L. Gastrocnemius (Medial head) Normal None None None _______ Normal Normal Normal Normal  L. Gluteus medius Normal None None None _______ Normal Normal Normal Normal  L. Lumbar paraspinals (mid) Normal None None None _______ Normal Normal Normal Normal  L. Lumbar paraspinals (low) Normal None None None _______ Normal Normal Normal Normal

## 2016-03-22 ENCOUNTER — Encounter (HOSPITAL_BASED_OUTPATIENT_CLINIC_OR_DEPARTMENT_OTHER): Payer: Self-pay

## 2016-03-22 ENCOUNTER — Emergency Department (HOSPITAL_BASED_OUTPATIENT_CLINIC_OR_DEPARTMENT_OTHER): Payer: Medicaid Other

## 2016-03-22 ENCOUNTER — Emergency Department (HOSPITAL_BASED_OUTPATIENT_CLINIC_OR_DEPARTMENT_OTHER)
Admission: EM | Admit: 2016-03-22 | Discharge: 2016-03-22 | Discharge status: Against Medical Advice | Payer: Medicaid Other | Attending: Emergency Medicine | Admitting: Emergency Medicine

## 2016-03-22 DIAGNOSIS — R0789 Other chest pain: Secondary | ICD-10-CM | POA: Diagnosis not present

## 2016-03-22 DIAGNOSIS — R58 Hemorrhage, not elsewhere classified: Secondary | ICD-10-CM | POA: Diagnosis not present

## 2016-03-22 DIAGNOSIS — Z79899 Other long term (current) drug therapy: Secondary | ICD-10-CM | POA: Diagnosis not present

## 2016-03-22 DIAGNOSIS — F17210 Nicotine dependence, cigarettes, uncomplicated: Secondary | ICD-10-CM | POA: Diagnosis not present

## 2016-03-22 DIAGNOSIS — R079 Chest pain, unspecified: Secondary | ICD-10-CM | POA: Diagnosis present

## 2016-03-22 LAB — BASIC METABOLIC PANEL
Anion gap: 9 (ref 5–15)
BUN: 21 mg/dL — ABNORMAL HIGH (ref 6–20)
CHLORIDE: 101 mmol/L (ref 101–111)
CO2: 27 mmol/L (ref 22–32)
CREATININE: 0.95 mg/dL (ref 0.61–1.24)
Calcium: 9.4 mg/dL (ref 8.9–10.3)
GFR calc non Af Amer: >60 mL/min (ref >60)
Glucose, Bld: 109 mg/dL — ABNORMAL HIGH (ref 65–99)
POTASSIUM: 4.1 mmol/L (ref 3.5–5.1)
Sodium: 137 mmol/L (ref 135–145)

## 2016-03-22 LAB — TROPONIN I: Troponin I: <0.03 ng/mL (ref <0.03)

## 2016-03-22 LAB — CBC
HEMATOCRIT: 50.4 % (ref 39.0–52.0)
HEMOGLOBIN: 17.1 g/dL — AB (ref 13.0–17.0)
MCH: 29.5 pg (ref 26.0–34.0)
MCHC: 33.9 g/dL (ref 30.0–36.0)
MCV: 87 fL (ref 78.0–100.0)
Platelets: 124 10*3/uL — ABNORMAL LOW (ref 150–400)
RBC: 5.79 MIL/uL (ref 4.22–5.81)
RDW: 13.3 % (ref 11.5–15.5)
WBC: 7.9 10*3/uL (ref 4.0–10.5)

## 2016-03-22 NOTE — ED Provider Notes (Signed)
[facility]   CSN: [id number] Arrival date & time: 03/22/16  1137     History   Chief Complaint Chief Complaint  Patient presents with  . Chest Pain    HPI Daniel ArbourRobert Solis is a 40 y.o. male who presents with chest pain. PMH significant for HTN, hx of TIA, current tobacco use. He states that over the past 4 days he has had constant, non-radiating, right upper chest pain which has gradually worsened. It is sharp/stabing. It is worse with movement and palpation. He also endorses pain with inspiration. He denies fever, chills, cough, abdominal pain, N/V, paresthesias, recent surgery/travel/immobilization, hx of cancer, leg swelling, hemptysis, prior DVT/PE, or hormone use. He states he is getting ready to go on vacation on Friday and wanted to get checked out before he goes.   HPI  Past Medical History:  Diagnosis Date  . Anxiety   . Attention deficit disorder   . Cigarette smoker   . TIA (transient ischemic attack)     Patient Active Problem List   Diagnosis Date Noted  . Paresthesia 02/04/2016  . Smoker 02/13/2011  . Left hemiparesis (HCC) 02/13/2011  . 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 ARM DEBRIDEMENT         Home Medications    Prior to Admission medications   Medication Sig Start Date End Date Taking? Authorizing Provider  METOPROLOL TARTRATE PO Take by mouth.   Yes Historical Provider, MD  UNKNOWN TO PATIENT BP med   Yes Historical Provider, MD  ALPRAZolam (XANAX) 0.5 MG tablet Take 0.5 mg by mouth 3 (three) times daily as needed for anxiety.     Historical Provider, MD  amphetamine-dextroamphetamine (ADDERALL) 30 MG tablet Take 30 mg by mouth daily.     Historical Provider, MD    Family History Family History  Problem Relation Age of Onset  . Cancer Other     Social History Social History  Substance Use Topics  . Smoking status: Current Every Day Smoker   Packs/day: 1.00    Types: Cigarettes  . Smokeless tobacco: Never Used  . Alcohol use No     Allergies   Patient has no known allergies.   Review of Systems Review of Systems  Constitutional: Negative for chills and fever.  Respiratory: Negative for cough and shortness of breath.   Cardiovascular: Positive for chest pain. Negative for palpitations and leg swelling.  Gastrointestinal: Negative for abdominal pain, nausea and vomiting.  All other systems reviewed and are negative.    Physical Exam Updated Vital Signs BP (!) 134/108 (BP Location: Right Arm)   Pulse 95   Temp 98.4 F (36.9 C) (Oral)   Resp 20   SpO2 99%   Physical Exam  Constitutional: He is oriented to person, place, and time. He appears well-developed and well-nourished. No distress.  HENT:  Head: Normocephalic and atraumatic.  Eyes: Conjunctivae are normal. Pupils are equal, round, and reactive to light. Right eye exhibits no discharge. Left eye exhibits no discharge. No scleral icterus.  Neck: Normal range of motion.  Cardiovascular: Normal rate and regular rhythm.  Exam reveals no gallop and no friction rub.   No murmur heard. Pulmonary/Chest: Effort normal. No respiratory distress. He has decreased breath sounds. He has no wheezes. He has no rales. He exhibits tenderness (Reproducible tenderness over right upper chest wall).  2x4 cm ecchymosis noted over right lower ribs  Abdominal: Soft. Bowel sounds are  normal. He exhibits no distension and no mass. There is no tenderness. There is no rebound and no guarding. No hernia.  Neurological: He is alert and oriented to person, place, and time.  Skin: Skin is warm and dry.  Psychiatric: He has a normal mood and affect. His behavior is normal.  Nursing note and vitals reviewed.    ED Treatments / Results  Labs (all labs ordered are listed, but only abnormal results are displayed) Labs Reviewed  BASIC METABOLIC PANEL - Abnormal; Notable for the following:        Result Value   Glucose, Bld 109 (*)    BUN 21 (*)    All other components within normal limits  CBC - Abnormal; Notable for the following:    Hemoglobin 17.1 (*)    Platelets 124 (*)    All other components within normal limits  TROPONIN I    EKG  EKG Interpretation  Date/Time:  Wednesday March 22 2016 11:46:00 EST Ventricular Rate:  92 PR Interval:  160 QRS Duration: 100 QT Interval:  352 QTC Calculation: 435 R Axis:   78 Text Interpretation:  Normal sinus rhythm with sinus arrhythmia Normal ECG No significant change since last tracing Confirmed by Karma GanjaLINKER  MD, MARTHA [phone]([id number]) on 03/22/2016 11:51:52 AM Also confirmed by Karma GanjaLINKER  MD, MARTHA [phone]), editor Valentina LucksStout CT, Jola BabinskiMarilyn [phone]([id number])  on 03/22/2016 12:36:50 PM       Radiology Dg Chest 2 View  Result Date: 03/22/2016 CLINICAL DATA:  Right-sided chest pain. EXAM: CHEST  2 VIEW COMPARISON:  CT chest 09/19/2011 FINDINGS: The heart size and mediastinal contours are within normal limits. Both lungs are clear. The visualized skeletal structures are unremarkable. IMPRESSION: No active cardiopulmonary disease. Electronically Signed   By: Elige KoHetal  Patel   On: 03/22/2016 13:35    Procedures Procedures (including critical care time)  Medications Ordered in ED Medications - No data to display   Initial Impression / Assessment and Plan / ED Course  I have reviewed the triage vital signs and the nursing notes.  Pertinent labs & imaging results that were available during my care of the patient were reviewed by me and considered in my medical decision making (see chart for details).  Clinical Course    40 year old with atypical chest pain. Chest pain work up is reassuring. EKG is NSR and shows no significant change since last. CXR is negative. Troponin is 0. Labs are unremarkable. No significant past or family hx of cardiac disease.   Went to update patient on results and nursing staff informed me that patient had eloped from the  ED.    Final Clinical Impressions(s) / ED Diagnoses   Final diagnoses:  Atypical chest pain    New Prescriptions New Prescriptions   No medications on file     Daniel BornKelly Marie Nyheim Seufert, PA-C 03/22/16 1634    Jerelyn ScottMartha Linker, MD 03/22/16 [phone]

## 2016-03-22 NOTE — ED Notes (Signed)
Pt not found in dept or lobby. Did not inform staff he was leaving

## 2016-03-22 NOTE — ED Triage Notes (Signed)
Right side CP x 5 days-NAD-stead gait

## 2016-09-04 ENCOUNTER — Emergency Department (HOSPITAL_BASED_OUTPATIENT_CLINIC_OR_DEPARTMENT_OTHER)
Admission: EM | Admit: 2016-09-04 | Discharge: 2016-09-04 | Discharge status: Against Medical Advice | Payer: Medicaid Other | Attending: Emergency Medicine | Admitting: Emergency Medicine

## 2016-09-04 ENCOUNTER — Encounter (HOSPITAL_BASED_OUTPATIENT_CLINIC_OR_DEPARTMENT_OTHER): Payer: Self-pay

## 2016-09-04 DIAGNOSIS — I10 Essential (primary) hypertension: Secondary | ICD-10-CM | POA: Insufficient documentation

## 2016-09-04 DIAGNOSIS — F17210 Nicotine dependence, cigarettes, uncomplicated: Secondary | ICD-10-CM | POA: Diagnosis not present

## 2016-09-04 DIAGNOSIS — W57XXXA Bitten or stung by nonvenomous insect and other nonvenomous arthropods, initial encounter: Secondary | ICD-10-CM | POA: Diagnosis not present

## 2016-09-04 DIAGNOSIS — Y9301 Activity, walking, marching and hiking: Secondary | ICD-10-CM | POA: Insufficient documentation

## 2016-09-04 DIAGNOSIS — S90861A Insect bite (nonvenomous), right foot, initial encounter: Secondary | ICD-10-CM | POA: Insufficient documentation

## 2016-09-04 DIAGNOSIS — Y999 Unspecified external cause status: Secondary | ICD-10-CM | POA: Diagnosis not present

## 2016-09-04 DIAGNOSIS — M79671 Pain in right foot: Secondary | ICD-10-CM | POA: Diagnosis present

## 2016-09-04 DIAGNOSIS — Y92007 Garden or yard of unspecified non-institutional (private) residence as the place of occurrence of the external cause: Secondary | ICD-10-CM | POA: Diagnosis not present

## 2016-09-04 DIAGNOSIS — T7840XA Allergy, unspecified, initial encounter: Secondary | ICD-10-CM

## 2016-09-04 HISTORY — DX: Essential (primary) hypertension: I10

## 2016-09-04 MED ORDER — FAMOTIDINE IN NACL 20-0.9 MG/50ML-% IV SOLN
20.0000 mg | Freq: Once | INTRAVENOUS | Status: AC
  Administered 2016-09-04: 20 mg via INTRAVENOUS
  Filled 2016-09-04: qty 50

## 2016-09-04 MED ORDER — METHYLPREDNISOLONE SODIUM SUCC 125 MG IJ SOLR
125.0000 mg | Freq: Once | INTRAMUSCULAR | Status: AC
  Administered 2016-09-04: 125 mg via INTRAVENOUS
  Filled 2016-09-04: qty 2

## 2016-09-04 MED ORDER — DIPHENHYDRAMINE HCL 50 MG/ML IJ SOLN
50.0000 mg | Freq: Once | INTRAMUSCULAR | Status: AC
  Administered 2016-09-04: 50 mg via INTRAVENOUS
  Filled 2016-09-04: qty 1

## 2016-09-04 MED ORDER — PROMETHAZINE HCL 25 MG PO TABS: 12.5000 mg | ORAL_TABLET | Freq: Once | ORAL | Status: DC

## 2016-09-04 MED ORDER — KETOROLAC TROMETHAMINE 30 MG/ML IJ SOLN: 30.0000 mg | Freq: Once | INTRAMUSCULAR | Status: DC

## 2016-09-04 MED ORDER — SODIUM CHLORIDE 0.9 % IV BOLUS (SEPSIS)
1000.0000 mL | Freq: Once | INTRAVENOUS | Status: AC
  Administered 2016-09-04: 1000 mL via INTRAVENOUS

## 2016-09-04 NOTE — ED Notes (Signed)
Pt not located in room; no evidence of IV in trash or room; Security notified.

## 2016-09-04 NOTE — ED Provider Notes (Signed)
[facility]   CSN: [id number] Arrival date & time: 09/04/16  1204     History   Chief Complaint Chief Complaint  Patient presents with  . Insect Bite    HPI Daniel Solis is a 41 y.o. male who presents with suspected insect bite to right foot. Patient reports an unknown insect bit him while walking in his yard and flip flops around 11:15 AM today. Patient had severe, acute onset pain last around 20 minutes. Now the patient has a tingling sensation to the area and pain and cramping to his right lower extremity up to his hip. He has a has associated nausea and lightheadedness. He denies any difficulty breathing, swelling of his lips, tongue, or throat, or abdominal pain. Patient sprayed Bactine on the affected area, but no other medication was taken prior to arrival. Patient has no known allergies.  HPI  Past Medical History:  Diagnosis Date  . Anxiety   . Attention deficit disorder   . Cigarette smoker   . Hypertension   . TIA (transient ischemic attack)     Patient Active Problem List   Diagnosis Date Noted  . Paresthesia 02/04/2016  . Smoker 02/13/2011  . Left hemiparesis (HCC) 02/13/2011  . 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 ARM DEBRIDEMENT         Home Medications    Prior to Admission medications   Medication Sig Start Date End Date Taking? Authorizing Provider  ALPRAZolam Prudy Feeler) 0.5 MG tablet Take 0.5 mg by mouth 3 (three) times daily as needed for anxiety.     [provider]  amphetamine-dextroamphetamine (ADDERALL) 30 MG tablet Take 30 mg by mouth daily.     [provider]  METOPROLOL TARTRATE PO Take by mouth.    [provider]  UNKNOWN TO PATIENT BP med    [provider]    Family History Family History  Problem Relation Age of Onset  . Cancer Other     Social History Social History  Substance Use Topics  . Smoking  status: Current Every Day Smoker    Packs/day: 1.00    Types: Cigarettes  . Smokeless tobacco: Never Used  . Alcohol use No     Allergies   Patient has no known allergies.   Review of Systems Review of Systems  Constitutional: Negative for chills and fever.  HENT: Negative for facial swelling and sore throat.   Respiratory: Negative for shortness of breath.   Cardiovascular: Negative for chest pain.  Gastrointestinal: Negative for abdominal pain, nausea and vomiting.  Genitourinary: Negative for dysuria.  Musculoskeletal: Negative for back pain.  Skin: Positive for wound (R foot). Negative for rash.  Neurological: Negative for headaches.  Psychiatric/Behavioral: The patient is not nervous/anxious.      Physical Exam Updated Vital Signs BP 124/79   Pulse 76   Temp 98.4 F (36.9 C) (Oral)   Resp 18   Ht 5\' 10"  (1.778 m)   Wt 98.9 kg (218 lb)   SpO2 95%   BMI 31.28 kg/m   Physical Exam  Constitutional: He appears well-developed and well-nourished. No distress.  Patient in no acute distress  HENT:  Head: Normocephalic and atraumatic.  Mouth/Throat: Oropharynx is clear and moist. No oropharyngeal exudate.  No edema noted to the lips, tongue, throat, uvula  Eyes: Conjunctivae are normal. Pupils are equal, round, and reactive to light. Right eye exhibits no discharge. Left eye exhibits  no discharge. No scleral icterus.  Neck: Normal range of motion. Neck supple. No thyromegaly present.  Cardiovascular: Normal rate, regular rhythm, normal heart sounds and intact distal pulses.  Exam reveals no gallop and no friction rub.   No murmur heard. Pulmonary/Chest: Effort normal and breath sounds normal. No stridor. No respiratory distress. He has no wheezes. He has no rales.  Abdominal: Soft. Bowel sounds are normal. He exhibits no distension. There is no tenderness. There is no rebound and no guarding.  Musculoskeletal: He exhibits no edema.  Tenderness right dorsal foot around  suspected insect bite; tenderness to right calf and right thigh; normal sensation; DP pulse is intact; cap refill <2 secs  Lymphadenopathy:    He has no cervical adenopathy.  Neurological: He is alert. Coordination normal.  Skin: Skin is warm and dry. No rash noted. He is not diaphoretic. No pallor.  Area of erythema with surrounding ecchymosis to right dorsal foot  Psychiatric: He has a normal mood and affect.  Nursing note and vitals reviewed.      ED Treatments / Results  Labs (all labs ordered are listed, but only abnormal results are displayed) Labs Reviewed - No data to display  EKG  EKG Interpretation None       Radiology No results found.  Procedures Procedures (including critical care time)  Medications Ordered in ED Medications  ketorolac (TORADOL) 30 MG/ML injection 30 mg (not administered)  promethazine (PHENERGAN) tablet 12.5 mg (not administered)  sodium chloride 0.9 % bolus 1,000 mL (0 mLs Intravenous Stopped 09/04/16 1323)  diphenhydrAMINE (BENADRYL) injection 50 mg (50 mg Intravenous Given 09/04/16 1241)  methylPREDNISolone sodium succinate (SOLU-MEDROL) 125 mg/2 mL injection 125 mg (125 mg Intravenous Given 09/04/16 1245)  famotidine (PEPCID) IVPB 20 mg premix (0 mg Intravenous Stopped 09/04/16 1323)     Initial Impression / Assessment and Plan / ED Course  I have reviewed the triage vital signs and the nursing notes.  Pertinent labs & imaging results that were available during my care of the patient were reviewed by me and considered in my medical decision making (see chart for details).     On reevaluation after 1 L fluid bolus, Benadryl, Solu-Medrol, Pepcid, patient states he is still having right lower extremity cramping, however his abdominal cramping is improved. He states he is feeling somewhat anxious about this, so this may be component of his symptoms.  Plan to discharge patient home with hydrocortisone cream. He is advised to keep watch on  the wound. The wound was demarcated with a skin marker. Patient evaluated by Dr. Juleen ChinaKohut who agrees with plan.  Patient eloped from the ED with IV suspected to be still in arm- see nursing note for more documentation regarding patient's elopement. Suspect patient's symptoms due to mild systemic reaction to insect bite. Doubt any further emergent management necessary at this time. I was not able to speak with the patient prior to his elopement.   Final Clinical Impressions(s) / ED Diagnoses   Final diagnoses:  Insect bite, initial encounter  Allergic reaction, initial encounter    New Prescriptions Discharge Medication List as of 09/04/2016  2:34 PM       Emi HolesLaw, Deundra Bard M, PA-C 09/04/16 1607    Raeford RazorKohut, Stephen, MD 09/05/16 1354

## 2016-09-04 NOTE — ED Triage Notes (Addendum)
Pt was in yard-"bit by something" approx 1115p-denies as snake bite-c/o cramps to right LE and nausea-NAD-steady gait

## 2017-07-13 ENCOUNTER — Emergency Department (HOSPITAL_COMMUNITY)
Admission: EM | Admit: 2017-07-13 | Discharge: 2017-07-13 | Disposition: A | Discharge status: Home / Self Care | Payer: Medicaid Other | Attending: Emergency Medicine | Admitting: Emergency Medicine

## 2017-07-13 ENCOUNTER — Encounter (HOSPITAL_COMMUNITY): Payer: Self-pay | Admitting: Emergency Medicine

## 2017-07-13 DIAGNOSIS — Z79899 Other long term (current) drug therapy: Secondary | ICD-10-CM | POA: Insufficient documentation

## 2017-07-13 DIAGNOSIS — F17210 Nicotine dependence, cigarettes, uncomplicated: Secondary | ICD-10-CM | POA: Diagnosis not present

## 2017-07-13 DIAGNOSIS — Y999 Unspecified external cause status: Secondary | ICD-10-CM | POA: Diagnosis not present

## 2017-07-13 DIAGNOSIS — Y929 Unspecified place or not applicable: Secondary | ICD-10-CM | POA: Diagnosis not present

## 2017-07-13 DIAGNOSIS — Y939 Activity, unspecified: Secondary | ICD-10-CM | POA: Diagnosis not present

## 2017-07-13 DIAGNOSIS — X500XXA Overexertion from strenuous movement or load, initial encounter: Secondary | ICD-10-CM | POA: Diagnosis not present

## 2017-07-13 DIAGNOSIS — I10 Essential (primary) hypertension: Secondary | ICD-10-CM | POA: Insufficient documentation

## 2017-07-13 DIAGNOSIS — M25511 Pain in right shoulder: Secondary | ICD-10-CM | POA: Diagnosis present

## 2017-07-13 DIAGNOSIS — S46811A Strain of other muscles, fascia and tendons at shoulder and upper arm level, right arm, initial encounter: Secondary | ICD-10-CM | POA: Diagnosis not present

## 2017-07-13 MED ORDER — TRAMADOL HCL 50 MG PO TABS: 50.0000 mg | ORAL_TABLET | Freq: Four times a day (QID) | ORAL | 0 refills | Status: DC | PRN

## 2017-07-13 MED ORDER — NAPROXEN 500 MG PO TABS: 500.0000 mg | ORAL_TABLET | Freq: Two times a day (BID) | ORAL | 0 refills | Status: DC | PRN

## 2017-07-13 MED ORDER — OXYCODONE-ACETAMINOPHEN 5-325 MG PO TABS
2.0000 | ORAL_TABLET | Freq: Once | ORAL | Status: AC
  Administered 2017-07-13: 2 via ORAL
  Filled 2017-07-13: qty 2

## 2017-07-13 MED ORDER — CYCLOBENZAPRINE HCL 10 MG PO TABS: 10.0000 mg | ORAL_TABLET | Freq: Three times a day (TID) | ORAL | 0 refills | Status: DC | PRN

## 2017-07-13 NOTE — Discharge Instructions (Signed)
Do not lift anything over 20 pounds for the next 2 weeks.  Take medications as prescribed.  He can wear the sling for comfort for up to 3 to 5 days, but make sure you take this off regularly and move your shoulder, to prevent frozen shoulder.

## 2017-07-13 NOTE — ED Provider Notes (Signed)
[facility]-EMERGENCY DEPT Provider Note   CSN: [id number] Arrival date & time: 07/13/17  1011     History   Chief Complaint Chief Complaint  Patient presents with  . Back Pain    HPI Kate Sweetman is a 42 y.o. male.  HPI 42 year old right-hand-dominant male here with right shoulder pain.  Patient states that 2 weeks ago, he was lifting something heavy for a friend when he felt a popping sensation in his right scapula.  Since then, he is an aching, throbbing, positional, right scapular and back pain.  This seemed to improve over 2 weeks, but he was lifting an air conditioning unit yesterday, when he had recurrence and now severe pain.  The pain is aching, throbbing, worse with movement and palpation.  No alleviating factors.  No fall.  No distal numbness or weakness.  No other medical complaints.   Past Medical History:  Diagnosis Date  . Anxiety   . Attention deficit disorder   . Cigarette smoker   . Hypertension   . TIA (transient ischemic attack)     Patient Active Problem List   Diagnosis Date Noted  . Paresthesia 02/04/2016  . Smoker 02/13/2011  . Left hemiparesis (HCC) 02/13/2011  . 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 ARM DEBRIDEMENT          Home Medications    Prior to Admission medications   Medication Sig Start Date End Date Taking? Authorizing Provider  ADDERALL XR 30 MG 24 hr capsule Take 30 mg by mouth 2 (two) times daily. 07/05/17  Yes [provider]  ALPRAZolam Prudy Feeler) 1 MG tablet Take 1 mg by mouth 2 (two) times daily. 06/27/17  Yes [provider]  metoprolol tartrate (LOPRESSOR) 50 MG tablet Take 50 mg by mouth 2 (two) times daily. 05/21/17  Yes [provider]  PROAIR HFA 108 (90 Base) MCG/ACT inhaler Inhale 2 puffs into the lungs every 4 (four) hours as needed. 06/27/17  Yes [provider]  cyclobenzaprine (FLEXERIL) 10 MG tablet  Take 1 tablet (10 mg total) by mouth 3 (three) times daily as needed for muscle spasms. 07/13/17   Shaune Pollack, MD  naproxen (NAPROSYN) 500 MG tablet Take 1 tablet (500 mg total) by mouth 2 (two) times daily as needed for moderate pain. 07/13/17   Shaune Pollack, MD  traMADol (ULTRAM) 50 MG tablet Take 1 tablet (50 mg total) by mouth every 6 (six) hours as needed for severe pain. 07/13/17   Shaune Pollack, MD    Family History Family History  Problem Relation Age of Onset  . Cancer Other     Social History Social History   Tobacco Use  . Smoking status: Current Every Day Smoker    Packs/day: 1.00    Types: Cigarettes  . Smokeless tobacco: Never Used  Substance Use Topics  . Alcohol use: No  . Drug use: No     Allergies   Patient has no known allergies.   Review of Systems Review of Systems  Musculoskeletal: Positive for arthralgias and myalgias.  All other systems reviewed and are negative.    Physical Exam Updated Vital Signs BP (!) 125/92 (BP Location: Left Arm)   Pulse 81   Temp 97.6 F (36.4 C) (Oral)   Resp 16   Ht 5\' 10"  (1.778 m)   Wt 97.5 kg (215 lb)   SpO2 99%   BMI 30.85 kg/m   Physical Exam  Constitutional: He is oriented to person, place, and time. He appears well-developed and well-nourished. No distress.  HENT:  Head: Normocephalic and atraumatic.  Eyes: Conjunctivae are normal.  Neck: Neck supple.  Cardiovascular: Normal rate, regular rhythm and normal heart sounds.  Pulmonary/Chest: Effort normal. No respiratory distress. He has no wheezes.  Abdominal: He exhibits no distension.  Musculoskeletal: He exhibits no edema.  Neurological: He is alert and oriented to person, place, and time. He exhibits normal muscle tone.  Skin: Skin is warm. Capillary refill takes less than 2 seconds. No rash noted.  Nursing note and vitals reviewed.   UPPER EXTREMITY EXAM: RIGHT  INSPECTION & PALPATION: TTP over right inferior and lateral scapular  border, with paraspinal TTP. No midline T/L spine TTP.  SENSORY: Sensation is intact to light touch in:  Superficial radial nerve distribution (dorsal first web space) Median nerve distribution (tip of index finger)   Ulnar nerve distribution (tip of small finger)     MOTOR:  + Motor posterior interosseous nerve (thumb IP extension) + Anterior interosseous nerve (thumb IP flexion, index finger DIP flexion) + Radial nerve (wrist extension) + Median nerve (palpable firing thenar mass) + Ulnar nerve (palpable firing of first dorsal interosseous muscle)  VASCULAR: 2+ radial pulse Brisk capillary refill < 2 sec, fingers warm and well-perfused   ED Treatments / Results  Labs (all labs ordered are listed, but only abnormal results are displayed) Labs Reviewed - No data to display  EKG None  Radiology No results found.  Procedures Procedures (including critical care time)  Medications Ordered in ED Medications  oxyCODONE-acetaminophen (PERCOCET/ROXICET) 5-325 MG per tablet 2 tablet (2 tablets Oral Given 07/13/17 1321)     Initial Impression / Assessment and Plan / ED Course  I have reviewed the triage vital signs and the nursing notes.  Pertinent labs & imaging results that were available during my care of the patient were reviewed by me and considered in my medical decision making (see chart for details).     42 year old right-hand-dominant male here with likely trapezius and possible rhomboid sprain.  Will treat him with muscle relaxers and analgesia.  No signs of distal neurovascular compromise.  No fall or trauma or indication for plain films.  Final Clinical Impressions(s) / ED Diagnoses   Final diagnoses:  Strain of right trapezius muscle, initial encounter    ED Discharge Orders        Ordered    traMADol (ULTRAM) 50 MG tablet  Every 6 hours PRN     07/13/17 1347    cyclobenzaprine (FLEXERIL) 10 MG tablet  3 times daily PRN     07/13/17 1347    naproxen  (NAPROSYN) 500 MG tablet  2 times daily PRN     07/13/17 1348       Shaune PollackIsaacs, Livana Yerian, MD 07/13/17 1348

## 2017-07-13 NOTE — ED Notes (Signed)
Bed: [facility] Expected date:  Expected time:  Means of arrival:  Comments: 

## 2017-07-13 NOTE — ED Triage Notes (Signed)
Pt reports for about 2 weeks has right upper back/shoulder area after moving heavy furniture.

## 2017-10-14 ENCOUNTER — Other Ambulatory Visit: Payer: Self-pay

## 2017-10-14 ENCOUNTER — Encounter (HOSPITAL_COMMUNITY): Payer: Self-pay | Admitting: *Deleted

## 2017-10-14 ENCOUNTER — Emergency Department (HOSPITAL_COMMUNITY)
Admission: EM | Admit: 2017-10-14 | Discharge: 2017-10-14 | Disposition: A | Discharge status: Home / Self Care | Payer: Medicaid Other | Attending: Emergency Medicine | Admitting: Emergency Medicine

## 2017-10-14 ENCOUNTER — Emergency Department (HOSPITAL_COMMUNITY): Payer: Medicaid Other

## 2017-10-14 DIAGNOSIS — Z8673 Personal history of transient ischemic attack (TIA), and cerebral infarction without residual deficits: Secondary | ICD-10-CM | POA: Diagnosis not present

## 2017-10-14 DIAGNOSIS — R1031 Right lower quadrant pain: Secondary | ICD-10-CM

## 2017-10-14 DIAGNOSIS — F17210 Nicotine dependence, cigarettes, uncomplicated: Secondary | ICD-10-CM | POA: Diagnosis not present

## 2017-10-14 DIAGNOSIS — I10 Essential (primary) hypertension: Secondary | ICD-10-CM | POA: Diagnosis not present

## 2017-10-14 DIAGNOSIS — Z79899 Other long term (current) drug therapy: Secondary | ICD-10-CM | POA: Diagnosis not present

## 2017-10-14 HISTORY — DX: Unspecified viral hepatitis C without hepatic coma: B19.20

## 2017-10-14 LAB — CBC
HEMATOCRIT: 50.2 % (ref 39.0–52.0)
HEMOGLOBIN: 17.1 g/dL — AB (ref 13.0–17.0)
MCH: 31 pg (ref 26.0–34.0)
MCHC: 34.1 g/dL (ref 30.0–36.0)
MCV: 90.9 fL (ref 78.0–100.0)
Platelets: 160 10*3/uL (ref 150–400)
RBC: 5.52 MIL/uL (ref 4.22–5.81)
RDW: 12.7 % (ref 11.5–15.5)
WBC: 10.6 10*3/uL — ABNORMAL HIGH (ref 4.0–10.5)

## 2017-10-14 LAB — COMPREHENSIVE METABOLIC PANEL
ALBUMIN: 4.3 g/dL (ref 3.5–5.0)
ALK PHOS: 60 U/L (ref 38–126)
ALT: 33 U/L (ref 0–44)
ANION GAP: 9 (ref 5–15)
AST: 31 U/L (ref 15–41)
BUN: 28 mg/dL — AB (ref 6–20)
CO2: 29 mmol/L (ref 22–32)
Calcium: 9.2 mg/dL (ref 8.9–10.3)
Chloride: 99 mmol/L (ref 98–111)
Creatinine, Ser: 0.92 mg/dL (ref 0.61–1.24)
GFR calc Af Amer: >60 mL/min (ref >60)
GFR calc non Af Amer: >60 mL/min (ref >60)
GLUCOSE: 114 mg/dL — AB (ref 70–99)
POTASSIUM: 4.6 mmol/L (ref 3.5–5.1)
SODIUM: 137 mmol/L (ref 135–145)
Total Bilirubin: 0.5 mg/dL (ref 0.3–1.2)
Total Protein: 7.3 g/dL (ref 6.5–8.1)

## 2017-10-14 LAB — URINALYSIS, ROUTINE W REFLEX MICROSCOPIC
BILIRUBIN URINE: NEGATIVE
GLUCOSE, UA: NEGATIVE mg/dL
HGB URINE DIPSTICK: NEGATIVE
Ketones, ur: NEGATIVE mg/dL
Leukocytes, UA: NEGATIVE
Nitrite: NEGATIVE
PH: 5 (ref 5.0–8.0)
Protein, ur: NEGATIVE mg/dL
Specific Gravity, Urine: 1.028 (ref 1.005–1.030)

## 2017-10-14 LAB — LIPASE, BLOOD: Lipase: 34 U/L (ref 11–51)

## 2017-10-14 MED ORDER — MORPHINE SULFATE (PF) 4 MG/ML IV SOLN
4.0000 mg | Freq: Once | INTRAVENOUS | Status: AC
  Administered 2017-10-14: 4 mg via INTRAVENOUS
  Filled 2017-10-14: qty 1

## 2017-10-14 MED ORDER — IOPAMIDOL (ISOVUE-300) INJECTION 61%
INTRAVENOUS | Status: AC
  Filled 2017-10-14: qty 100

## 2017-10-14 MED ORDER — IOPAMIDOL (ISOVUE-300) INJECTION 61%
100.0000 mL | Freq: Once | INTRAVENOUS | Status: AC | PRN
  Administered 2017-10-14: 100 mL via INTRAVENOUS

## 2017-10-14 MED ORDER — KETOROLAC TROMETHAMINE 30 MG/ML IJ SOLN
15.0000 mg | Freq: Once | INTRAMUSCULAR | Status: AC
Start: 2017-10-14 — End: 2017-10-14
  Administered 2017-10-14: 15 mg via INTRAVENOUS
  Filled 2017-10-14: qty 1

## 2017-10-14 MED ORDER — SODIUM CHLORIDE 0.9 % IV BOLUS
1000.0000 mL | Freq: Once | INTRAVENOUS | Status: AC
  Administered 2017-10-14: 1000 mL via INTRAVENOUS

## 2017-10-14 NOTE — ED Provider Notes (Signed)
[facility]-EMERGENCY DEPT Provider Note   CSN: [id number] Arrival date & time: 10/14/17  1930     History   Chief Complaint Chief Complaint  Patient presents with  . Abdominal Pain    RLQ    HPI Daniel ArbourRobert Telfair is a 42 y.o. male who presents with R groin/RLQ pain. PMH significant for HTN, hx of TIA, tobacco abuse, anxiety, meralgia paresthetica, Hep C He states that over the past week he has had a discomfort in the RLQ and right inguinal area. It is intermittent. Nothing makes it better or worse. It feels like a burning "nerve" pain and like shocks at times. He climbs ladders for work but denies any injury. He feels hot and lightheaded today which he thinks is related to his blood pressure. He denies chest pain, SOB, back pain, N/V/D, numbness/tingling. He does have difficulty urinating at times but was able to urinate normally today. Urinating sometimes makes the pain worse. No penis or testicular pain. No prior abdominal surgeries. He's had several colonoscopy due to family hx of colon cancer which have been normal.  HPI  Past Medical History:  Diagnosis Date  . Anxiety   . Attention deficit disorder   . Cigarette smoker   . Hypertension   . TIA (transient ischemic attack)     Patient Active Problem List   Diagnosis Date Noted  . Paresthesia 02/04/2016  . Smoker 02/13/2011  . Left hemiparesis (HCC) 02/13/2011  . 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 ARM DEBRIDEMENT          Home Medications    Prior to Admission medications   Medication Sig Start Date End Date Taking? Authorizing Provider  ADDERALL XR 30 MG 24 hr capsule Take 30 mg by mouth 2 (two) times daily. 07/05/17  Yes [provider]  ALPRAZolam Prudy Feeler(XANAX) 1 MG tablet Take 1 mg by mouth 2 (two) times daily. 06/27/17  Yes [provider]  cyclobenzaprine (FLEXERIL) 10 MG tablet Take 1 tablet (10 mg total) by mouth 3  (three) times daily as needed for muscle spasms. 07/13/17  Yes Shaune PollackIsaacs, Cameron, MD  metoprolol tartrate (LOPRESSOR) 50 MG tablet Take 50 mg by mouth 2 (two) times daily. 05/21/17  Yes [provider]  Multiple Vitamin (MULTIVITAMIN WITH MINERALS) TABS tablet Take 1 tablet by mouth daily.   Yes [provider]  PROAIR HFA 108 (90 Base) MCG/ACT inhaler Inhale 2 puffs into the lungs every 4 (four) hours as needed. 06/27/17  Yes [provider]  naproxen (NAPROSYN) 500 MG tablet Take 1 tablet (500 mg total) by mouth 2 (two) times daily as needed for moderate pain. Patient not taking: Reported on 10/14/2017 07/13/17   Shaune PollackIsaacs, Cameron, MD  traMADol (ULTRAM) 50 MG tablet Take 1 tablet (50 mg total) by mouth every 6 (six) hours as needed for severe pain. Patient not taking: Reported on 10/14/2017 07/13/17   Shaune PollackIsaacs, Cameron, MD    Family History Family History  Problem Relation Age of Onset  . Cancer Other     Social History Social History   Tobacco Use  . Smoking status: Current Every Day Smoker    Packs/day: 1.00    Types: Cigarettes  . Smokeless tobacco: Never Used  Substance Use Topics  . Alcohol use: No  . Drug use: No     Allergies   Patient has no known allergies.   Review of Systems Review of Systems  Constitutional:  Negative for chills and fever.  Respiratory: Negative for shortness of breath.   Cardiovascular: Negative for chest pain.  Gastrointestinal: Positive for abdominal pain. Negative for diarrhea, nausea and vomiting.  Genitourinary: Positive for difficulty urinating and flank pain. Negative for dysuria, penile pain and testicular pain.  Musculoskeletal: Negative for back pain.  Neurological: Positive for light-headedness.  Psychiatric/Behavioral: The patient is nervous/anxious.   All other systems reviewed and are negative.    Physical Exam Updated Vital Signs BP (!) 165/119 (BP Location: Right Arm)   Pulse 63   Temp 98.3 F (36.8 C)  (Oral)   Resp 16   Ht 5\' 10"  (1.778 m)   Wt 104.3 kg (230 lb)   SpO2 100%   BMI 33.00 kg/m   Physical Exam  Constitutional: He is oriented to person, place, and time. He appears well-developed and well-nourished. No distress.  Anxious, cooperative  HENT:  Head: Normocephalic and atraumatic.  Eyes: Pupils are equal, round, and reactive to light. Conjunctivae are normal. Right eye exhibits no discharge. Left eye exhibits no discharge. No scleral icterus.  Neck: Normal range of motion.  Cardiovascular: Normal rate and regular rhythm.  Pulmonary/Chest: Effort normal and breath sounds normal. No respiratory distress.  Abdominal: Soft. Bowel sounds are normal. He exhibits no distension and no mass. There is tenderness (RLQ and right inguinal tenderness). There is no rebound and no guarding. No hernia.  Neurological: He is alert and oriented to person, place, and time.  Skin: Skin is warm and dry.  Psychiatric: His behavior is normal. His mood appears anxious.  Nursing note and vitals reviewed.    ED Treatments / Results  Labs (all labs ordered are listed, but only abnormal results are displayed) Labs Reviewed  COMPREHENSIVE METABOLIC PANEL - Abnormal; Notable for the following components:      Result Value   Glucose, Bld 114 (*)    BUN 28 (*)    All other components within normal limits  CBC - Abnormal; Notable for the following components:   WBC 10.6 (*)    Hemoglobin 17.1 (*)    All other components within normal limits  LIPASE, BLOOD  URINALYSIS, ROUTINE W REFLEX MICROSCOPIC    EKG None  Radiology No results found.  Procedures Procedures (including critical care time)  Medications Ordered in ED Medications  iopamidol (ISOVUE-300) 61 % injection (has no administration in time range)  morphine 4 MG/ML injection 4 mg (4 mg Intravenous Given 10/14/17 2035)  ketorolac (TORADOL) 30 MG/ML injection 15 mg (15 mg Intravenous Given 10/14/17 2035)  sodium chloride 0.9 % bolus  1,000 mL (1,000 mLs Intravenous New Bag/Given 10/14/17 2033)  iopamidol (ISOVUE-300) 61 % injection 100 mL (100 mLs Intravenous Contrast Given 10/14/17 2206)     Initial Impression / Assessment and Plan / ED Course  I have reviewed the triage vital signs and the nursing notes.  Pertinent labs & imaging results that were available during my care of the patient were reviewed by me and considered in my medical decision making (see chart for details).  42 year old male presents with RLQ/inguinal pain for one week, worsening today. DDx includes appendicitis, kidney stone, torsion although I have a low suspicion for these since his symptoms have been present for 1 week. More likely pain is MSK or meralgia paresthetica again. CBC is remarkable for mild leukocytosis of 10.6. CMP is normal. Lipase is normal. UA is clean. Shared decision making was made. Pain is atypical sounding but he is very anxious  and he does have RLQ tenderness. Will provide pain control and obtain CT Abdomen/pelvis.  CBC, CMP, lipase, UA are all unremarkable. CT is normal. Discussed results with patient. Pain is improved after medication. He has a f/u appt with his primary doctor in August. He was advised to return if worsening  Final Clinical Impressions(s) / ED Diagnoses   Final diagnoses:  Right lower quadrant abdominal pain    ED Discharge Orders    None       Bethel Born, PA-C 10/14/17 2315    Charlynne Pander, MD 10/15/17 [phone]

## 2017-10-14 NOTE — ED Triage Notes (Signed)
Pt c/o RLQ pain x 1 week.  Pt denies n/v/d.

## 2017-10-14 NOTE — Discharge Instructions (Signed)
Please follow up with your doctor and return if worsening

## 2017-10-25 ENCOUNTER — Emergency Department (HOSPITAL_BASED_OUTPATIENT_CLINIC_OR_DEPARTMENT_OTHER)
Admission: EM | Admit: 2017-10-25 | Discharge: 2017-10-25 | Disposition: A | Discharge status: Home / Self Care | Payer: Medicaid Other | Attending: Emergency Medicine | Admitting: Emergency Medicine

## 2017-10-25 ENCOUNTER — Encounter (HOSPITAL_BASED_OUTPATIENT_CLINIC_OR_DEPARTMENT_OTHER): Payer: Self-pay | Admitting: Emergency Medicine

## 2017-10-25 ENCOUNTER — Emergency Department (HOSPITAL_BASED_OUTPATIENT_CLINIC_OR_DEPARTMENT_OTHER): Payer: Medicaid Other

## 2017-10-25 DIAGNOSIS — Z8673 Personal history of transient ischemic attack (TIA), and cerebral infarction without residual deficits: Secondary | ICD-10-CM | POA: Insufficient documentation

## 2017-10-25 DIAGNOSIS — Z79899 Other long term (current) drug therapy: Secondary | ICD-10-CM | POA: Insufficient documentation

## 2017-10-25 DIAGNOSIS — Y990 Civilian activity done for income or pay: Secondary | ICD-10-CM | POA: Insufficient documentation

## 2017-10-25 DIAGNOSIS — Y939 Activity, unspecified: Secondary | ICD-10-CM | POA: Diagnosis not present

## 2017-10-25 DIAGNOSIS — S6991XA Unspecified injury of right wrist, hand and finger(s), initial encounter: Secondary | ICD-10-CM | POA: Diagnosis present

## 2017-10-25 DIAGNOSIS — Y929 Unspecified place or not applicable: Secondary | ICD-10-CM | POA: Insufficient documentation

## 2017-10-25 DIAGNOSIS — F17210 Nicotine dependence, cigarettes, uncomplicated: Secondary | ICD-10-CM | POA: Insufficient documentation

## 2017-10-25 DIAGNOSIS — I10 Essential (primary) hypertension: Secondary | ICD-10-CM | POA: Diagnosis not present

## 2017-10-25 DIAGNOSIS — S63636A Sprain of interphalangeal joint of right little finger, initial encounter: Secondary | ICD-10-CM | POA: Diagnosis not present

## 2017-10-25 DIAGNOSIS — W010XXA Fall on same level from slipping, tripping and stumbling without subsequent striking against object, initial encounter: Secondary | ICD-10-CM | POA: Insufficient documentation

## 2017-10-25 NOTE — ED Triage Notes (Addendum)
Fell yesterday and having pain to right hand . Swelling noted to right hand, CMS intact, no LOC, limited ROM to 4th and 5th fingers

## 2017-10-25 NOTE — Discharge Instructions (Addendum)
Contact a health care provider if: You have pain or swelling that is getting worse. Your finger feels cold. Your finger looks out of place at the joint (deformity). You still cannot extend your finger after treatment. You have a fever. Get help right away if: Even after loosening your splint, your finger: Is very red and swollen. Is white or blue. Feels tingly or becomes numb.

## 2017-10-25 NOTE — ED Provider Notes (Signed)
[facility] EMERGENCY DEPARTMENT Provider Note   CSN: [id number] Arrival date & time: 10/25/17  1033     History   Chief Complaint Chief Complaint  Patient presents with  . Hand Injury    HPI Daniel Solis is a 42 y.o. male.  Resents the emergency department chief complaint of right fifth digit pain.  Patient states that he was working at his job with a heavy leaf blower on his back when he slipped on wet silicone and landed on his right hand in an outstretched fashion.  He states that he had pain in his left fifth digit that was not as bad yesterday however when he woke up this morning it was very bad he has swelling and is unable to close the finger into a fist.  He complains of pain worst at the PIP joint.  He denies numbness or tingling.  He has a previous boxer's fracture.  He is right-hand dominant  HPI  Past Medical History:  Diagnosis Date  . Anxiety   . Attention deficit disorder   . Cigarette smoker   . Hepatitis C   . Hypertension   . TIA (transient ischemic attack)     Patient Active Problem List   Diagnosis Date Noted  . Paresthesia 02/04/2016  . Smoker 02/13/2011  . Left hemiparesis (HCC) 02/13/2011  . 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 ARM DEBRIDEMENT          Home Medications    Prior to Admission medications   Medication Sig Start Date End Date Taking? Authorizing Provider  ADDERALL XR 30 MG 24 hr capsule Take 30 mg by mouth 2 (two) times daily. 07/05/17   [provider]  ALPRAZolam Prudy Feeler(XANAX) 1 MG tablet Take 1 mg by mouth 2 (two) times daily. 06/27/17   [provider]  cyclobenzaprine (FLEXERIL) 10 MG tablet Take 1 tablet (10 mg total) by mouth 3 (three) times daily as needed for muscle spasms. 07/13/17   Shaune PollackIsaacs, Cameron, MD  metoprolol tartrate (LOPRESSOR) 50 MG tablet Take 50 mg by mouth 2 (two) times daily. 05/21/17   [provider]  Multiple Vitamin  (MULTIVITAMIN WITH MINERALS) TABS tablet Take 1 tablet by mouth daily.    [provider]  PROAIR HFA 108 [phone](90 Base) MCG/ACT inhaler Inhale 2 puffs into the lungs every 4 (four) hours as needed. 06/27/17   [provider]    Family History Family History  Problem Relation Age of Onset  . Cancer Other     Social History Social History   Tobacco Use  . Smoking status: Current Every Day Smoker    Packs/day: 1.00    Types: Cigarettes  . Smokeless tobacco: Never Used  Substance Use Topics  . Alcohol use: No  . Drug use: No     Allergies   Patient has no known allergies.   Review of Systems Review of Systems  Constitutional: Negative for chills and fever.  Musculoskeletal: Positive for joint swelling.  Neurological: Negative for weakness and numbness.     Physical Exam Updated Vital Signs BP 112/87 (BP Location: Left Arm)   Pulse 62   Temp 98 F (36.7 C) (Oral)   Resp 16   Ht 5\' 10"  (1.778 m)   Wt 104.3 kg (230 lb)   SpO2 98%   BMI 33.00 kg/m   Physical Exam  Constitutional: He is oriented to person, place, and time. He appears well-developed and  well-nourished. No distress.  HENT:  Head: Normocephalic and atraumatic.  Eyes: Conjunctivae are normal. No scleral icterus.  Neck: Normal range of motion. Neck supple.  Cardiovascular: Normal rate, regular rhythm and normal heart sounds.  Pulmonary/Chest: Effort normal and breath sounds normal. No respiratory distress.  Abdominal: Soft. There is no tenderness.  Musculoskeletal: He exhibits edema.  Tenderness at the fifth PIP, swelling present.  Patient is unable to fully flex at the PIP joint.  Normal range of motion at the MCP.  No deformities, redness.  Neurological: He is alert and oriented to person, place, and time.  Skin: Skin is warm and dry. He is not diaphoretic.  Psychiatric: His behavior is normal.  Nursing note and vitals reviewed.    ED Treatments / Results  Labs (all labs ordered  are listed, but only abnormal results are displayed) Labs Reviewed - No data to display  EKG None  Radiology Dg Hand Complete Right  Result Date: 10/25/2017 CLINICAL DATA:  Fall. EXAM: RIGHT HAND - COMPLETE 3+ VIEW COMPARISON:  06/29/2008.  06/19/2008. FINDINGS: Old healed right fifth meta carpal fracture noted. No acute bony abnormality. No evidence of acute fracture dislocation. Small carpal cysts most likely degenerative. No radiopaque foreign body. IMPRESSION: Old healed right fifth metacarpal fracture. No acute abnormality identified. Electronically Signed   By: Maisie Fus  Register   On: 10/25/2017 11:09    Procedures .Splint Application Date/Time: 10/25/2017 12:11 PM Performed by: Arthor Captain, PA-C Authorized by: Arthor Captain, PA-C   Consent:    Consent obtained:  Verbal   Consent given by:  Patient   Risks discussed:  Discoloration, numbness, pain and swelling Pre-procedure details:    Sensation:  Normal Procedure details:    Laterality:  Right   Location:  Finger   Finger:  R small finger   Splint type:  Finger   Supplies:  Aluminum splint Post-procedure details:    Pain:  Improved   Sensation:  Normal   Patient tolerance of procedure:  Tolerated well, no immediate complications   (including critical care time)  Medications Ordered in ED Medications - No data to display   Initial Impression / Assessment and Plan / ED Course  I have reviewed the triage vital signs and the nursing notes.  Pertinent labs & imaging results that were available during my care of the patient were reviewed by me and considered in my medical decision making (see chart for details).     Patient with pain and swelling of the right fifth digit after hand injury.  I suspect that he jammed of the finger.  I personally reviewed the x-ray do not see any evidence of fracture or dislocation.  I agree with the radiologic interpretation.  Patient will be placed in finger splint and given hand  follow-up. No evidence of mallet finger, [place] finger.  The patient appears appropriate for discharge at this time.  Final Clinical Impressions(s) / ED Diagnoses   Final diagnoses:  Sprain of interphalangeal joint of right little finger, initial encounter    ED Discharge Orders    None       Arthor Captain, PA-C 10/25/17 1212    Tilden Fossa, MD 10/25/17 [phone]

## 2018-09-22 ENCOUNTER — Other Ambulatory Visit: Payer: Self-pay

## 2018-09-22 ENCOUNTER — Emergency Department (HOSPITAL_BASED_OUTPATIENT_CLINIC_OR_DEPARTMENT_OTHER): Payer: Medicaid Other

## 2018-09-22 ENCOUNTER — Encounter (HOSPITAL_BASED_OUTPATIENT_CLINIC_OR_DEPARTMENT_OTHER): Payer: Self-pay

## 2018-09-22 ENCOUNTER — Emergency Department (HOSPITAL_BASED_OUTPATIENT_CLINIC_OR_DEPARTMENT_OTHER)
Admission: EM | Admit: 2018-09-22 | Discharge: 2018-09-22 | Disposition: A | Discharge status: Home / Self Care | Payer: Medicaid Other | Attending: Emergency Medicine | Admitting: Emergency Medicine

## 2018-09-22 DIAGNOSIS — R109 Unspecified abdominal pain: Secondary | ICD-10-CM | POA: Insufficient documentation

## 2018-09-22 DIAGNOSIS — Z79899 Other long term (current) drug therapy: Secondary | ICD-10-CM | POA: Insufficient documentation

## 2018-09-22 DIAGNOSIS — I10 Essential (primary) hypertension: Secondary | ICD-10-CM | POA: Diagnosis not present

## 2018-09-22 DIAGNOSIS — F17210 Nicotine dependence, cigarettes, uncomplicated: Secondary | ICD-10-CM | POA: Insufficient documentation

## 2018-09-22 LAB — CBC WITH DIFFERENTIAL/PLATELET
Abs Immature Granulocytes: 0.06 10*3/uL (ref 0.00–0.07)
Basophils Absolute: 0.1 10*3/uL (ref 0.0–0.1)
Basophils Relative: 1 %
Eosinophils Absolute: 0.2 10*3/uL (ref 0.0–0.5)
Eosinophils Relative: 2 %
HCT: 49 % (ref 39.0–52.0)
Hemoglobin: 15.9 g/dL (ref 13.0–17.0)
Immature Granulocytes: 1 %
Lymphocytes Relative: 50 %
Lymphs Abs: 4.6 10*3/uL — ABNORMAL HIGH (ref 0.7–4.0)
MCH: 29.5 pg (ref 26.0–34.0)
MCHC: 32.4 g/dL (ref 30.0–36.0)
MCV: 90.9 fL (ref 80.0–100.0)
Monocytes Absolute: 1.1 10*3/uL — ABNORMAL HIGH (ref 0.1–1.0)
Monocytes Relative: 12 %
Neutro Abs: 3.1 10*3/uL (ref 1.7–7.7)
Neutrophils Relative %: 34 %
Platelets: 164 10*3/uL (ref 150–400)
RBC: 5.39 MIL/uL (ref 4.22–5.81)
RDW: 12.9 % (ref 11.5–15.5)
WBC: 9.1 10*3/uL (ref 4.0–10.5)
nRBC: 0 % (ref 0.0–0.2)

## 2018-09-22 LAB — URINALYSIS, MICROSCOPIC (REFLEX)

## 2018-09-22 LAB — BASIC METABOLIC PANEL
Anion gap: 8 (ref 5–15)
BUN: 22 mg/dL — ABNORMAL HIGH (ref 6–20)
CO2: 28 mmol/L (ref 22–32)
Calcium: 8.6 mg/dL — ABNORMAL LOW (ref 8.9–10.3)
Chloride: 105 mmol/L (ref 98–111)
Creatinine, Ser: 0.89 mg/dL (ref 0.61–1.24)
GFR calc Af Amer: >60 mL/min (ref >60)
GFR calc non Af Amer: >60 mL/min (ref >60)
Glucose, Bld: 103 mg/dL — ABNORMAL HIGH (ref 70–99)
Potassium: 3.5 mmol/L (ref 3.5–5.1)
Sodium: 141 mmol/L (ref 135–145)

## 2018-09-22 LAB — URINALYSIS, ROUTINE W REFLEX MICROSCOPIC
Bilirubin Urine: NEGATIVE
Glucose, UA: NEGATIVE mg/dL
Ketones, ur: NEGATIVE mg/dL
Leukocytes,Ua: NEGATIVE
Nitrite: NEGATIVE
Protein, ur: NEGATIVE mg/dL
Specific Gravity, Urine: 1.02 (ref 1.005–1.030)
pH: 7 (ref 5.0–8.0)

## 2018-09-22 MED ORDER — METHOCARBAMOL 500 MG PO TABS: 500.0000 mg | ORAL_TABLET | Freq: Two times a day (BID) | ORAL | 0 refills | Status: DC

## 2018-09-22 MED ORDER — LIDOCAINE 5 % EX PTCH: 1.0000 | MEDICATED_PATCH | CUTANEOUS | 0 refills | Status: DC

## 2018-09-22 MED ORDER — DICLOFENAC SODIUM ER 100 MG PO TB24: 100.0000 mg | ORAL_TABLET | Freq: Every day | ORAL | 0 refills | Status: DC

## 2018-09-22 MED ORDER — METHOCARBAMOL 500 MG PO TABS
1000.0000 mg | ORAL_TABLET | Freq: Once | ORAL | Status: AC
  Administered 2018-09-22: 05:00:00 1000 mg via ORAL
  Filled 2018-09-22: qty 2

## 2018-09-22 MED ORDER — SODIUM CHLORIDE 0.9 % IV BOLUS
500.0000 mL | Freq: Once | INTRAVENOUS | Status: AC
Start: 2018-09-22 — End: 2018-09-22
  Administered 2018-09-22: 500 mL via INTRAVENOUS

## 2018-09-22 MED ORDER — KETOROLAC TROMETHAMINE 30 MG/ML IJ SOLN
15.0000 mg | Freq: Once | INTRAMUSCULAR | Status: AC
  Administered 2018-09-22: 15 mg via INTRAVENOUS
  Filled 2018-09-22: qty 1

## 2018-09-22 MED ORDER — ONDANSETRON HCL 4 MG/2ML IJ SOLN
4.0000 mg | Freq: Once | INTRAMUSCULAR | Status: AC
  Administered 2018-09-22: 4 mg via INTRAVENOUS
  Filled 2018-09-22: qty 2

## 2018-09-22 NOTE — ED Triage Notes (Signed)
Right flank pain x 3 days. No nausea or dysuria.

## 2018-09-22 NOTE — ED Notes (Signed)
Pt to CT at this time.

## 2018-09-22 NOTE — ED Notes (Signed)
MD at bedside discussing results with patient at this time. 

## 2018-09-22 NOTE — ED Provider Notes (Signed)
[facility] EMERGENCY DEPARTMENT Provider Note   CSN: [id number] Arrival date & time: 09/22/18  [id number]     History   Chief Complaint Chief Complaint  Patient presents with  . Flank Pain    HPI Daniel Solis is a 43 y.o. male.     The history is provided by the patient.  Flank Pain This is a new problem. The current episode started more than 1 week ago. The problem occurs constantly. The problem has been gradually worsening. Pertinent negatives include no chest pain, no abdominal pain, no headaches and no shortness of breath. Nothing aggravates the symptoms. Nothing relieves the symptoms. He has tried nothing for the symptoms. The treatment provided no relief.    Past Medical History:  Diagnosis Date  . Anxiety   . Attention deficit disorder   . Cigarette smoker   . Hepatitis C   . Hypertension   . TIA (transient ischemic attack)     Patient Active Problem List   Diagnosis Date Noted  . Paresthesia 02/04/2016  . Smoker 02/13/2011  . Left hemiparesis (HCC) 02/13/2011  . 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 ARM DEBRIDEMENT          Home Medications    Prior to Admission medications   Medication Sig Start Date End Date Taking? Authorizing Provider  ADDERALL XR 30 MG 24 hr capsule Take 30 mg by mouth 2 (two) times daily. 07/05/17   [provider]  ALPRAZolam Prudy Feeler(XANAX) 1 MG tablet Take 1 mg by mouth 2 (two) times daily. 06/27/17   [provider]  cyclobenzaprine (FLEXERIL) 10 MG tablet Take 1 tablet (10 mg total) by mouth 3 (three) times daily as needed for muscle spasms. 07/13/17   Shaune PollackIsaacs, Cameron, MD  Diclofenac Sodium CR 100 MG 24 hr tablet Take 1 tablet (100 mg total) by mouth daily. 09/22/18   Keats Kingry, MD  lidocaine (LIDODERM) 5 % Place 1 patch onto the skin daily. Remove & Discard patch within 12 hours or as directed by MD 09/22/18   Nicanor AlconPalumbo, Rahaf Carbonell, MD  methocarbamol  (ROBAXIN) 500 MG tablet Take 1 tablet (500 mg total) by mouth 2 (two) times daily. 09/22/18   Cloud Graham, MD  metoprolol tartrate (LOPRESSOR) 50 MG tablet Take 50 mg by mouth 2 (two) times daily. 05/21/17   [provider]  Multiple Vitamin (MULTIVITAMIN WITH MINERALS) TABS tablet Take 1 tablet by mouth daily.    [provider]  PROAIR HFA 108 [phone](90 Base) MCG/ACT inhaler Inhale 2 puffs into the lungs every 4 (four) hours as needed. 06/27/17   [provider]    Family History Family History  Problem Relation Age of Onset  . Cancer Other     Social History Social History   Tobacco Use  . Smoking status: Current Every Day Smoker    Packs/day: 1.00    Types: Cigarettes  . Smokeless tobacco: Never Used  Substance Use Topics  . Alcohol use: No  . Drug use: No     Allergies   Patient has no known allergies.   Review of Systems Review of Systems  Constitutional: Negative for diaphoresis and fever.  Respiratory: Negative for cough and shortness of breath.   Cardiovascular: Negative for chest pain, palpitations and leg swelling.  Gastrointestinal: Negative for abdominal pain, nausea and vomiting.  Genitourinary: Positive for flank pain. Negative for dysuria.  Neurological: Negative for headaches.  All other systems reviewed and  are negative.    Physical Exam Updated Vital Signs BP (!) 158/111 (BP Location: Right Arm)   Pulse 77   Temp 98.3 F (36.8 C) (Oral)   Resp 18   Ht 5\' 9"  (1.753 m)   Wt 104 kg   SpO2 98%   BMI 33.86 kg/m   Physical Exam Vitals signs and nursing note reviewed.  Constitutional:      Appearance: He is obese. He is not ill-appearing.  HENT:     Head: Normocephalic and atraumatic.     Nose: Nose normal.  Eyes:     Conjunctiva/sclera: Conjunctivae normal.     Pupils: Pupils are equal, round, and reactive to light.  Neck:     Musculoskeletal: Normal range of motion and neck supple.  Cardiovascular:     Rate and  Rhythm: Normal rate and regular rhythm.     Pulses: Normal pulses.     Heart sounds: Normal heart sounds.  Pulmonary:     Effort: Pulmonary effort is normal.     Breath sounds: Normal breath sounds.  Abdominal:     General: Abdomen is flat. Bowel sounds are normal.     Tenderness: There is no abdominal tenderness. There is no guarding or rebound.  Musculoskeletal: Normal range of motion.  Skin:    General: Skin is warm and dry.     Capillary Refill: Capillary refill takes less than 2 seconds.  Neurological:     General: No focal deficit present.     Mental Status: He is alert and oriented to person, place, and time.  Psychiatric:        Mood and Affect: Mood normal.        Behavior: Behavior normal.      ED Treatments / Results  Labs (all labs ordered are listed, but only abnormal results are displayed) Results for orders placed or performed during the hospital encounter of 09/22/18  CBC with Differential/Platelet  Result Value Ref Range   WBC 9.1 4.0 - 10.5 K/uL   RBC 5.39 4.22 - 5.81 MIL/uL   Hemoglobin 15.9 13.0 - 17.0 g/dL   HCT 49.0 39.0 - 52.0 %   MCV 90.9 80.0 - 100.0 fL   MCH 29.5 26.0 - 34.0 pg   MCHC 32.4 30.0 - 36.0 g/dL   RDW 12.9 11.5 - 15.5 %   Platelets 164 150 - 400 K/uL   nRBC 0.0 0.0 - 0.2 %   Neutrophils Relative % 34 %   Neutro Abs 3.1 1.7 - 7.7 K/uL   Lymphocytes Relative 50 %   Lymphs Abs 4.6 (H) 0.7 - 4.0 K/uL   Monocytes Relative 12 %   Monocytes Absolute 1.1 (H) 0.1 - 1.0 K/uL   Eosinophils Relative 2 %   Eosinophils Absolute 0.2 0.0 - 0.5 K/uL   Basophils Relative 1 %   Basophils Absolute 0.1 0.0 - 0.1 K/uL   Immature Granulocytes 1 %   Abs Immature Granulocytes 0.06 0.00 - 0.07 K/uL  Basic metabolic panel  Result Value Ref Range   Sodium 141 135 - 145 mmol/L   Potassium 3.5 3.5 - 5.1 mmol/L   Chloride 105 98 - 111 mmol/L   CO2 28 22 - 32 mmol/L   Glucose, Bld 103 (H) 70 - 99 mg/dL   BUN 22 (H) 6 - 20 mg/dL   Creatinine, Ser 0.89  0.61 - 1.24 mg/dL   Calcium 8.6 (L) 8.9 - 10.3 mg/dL   GFR calc non Af Amer >60 >60 mL/min  GFR calc Af Amer >60 >60 mL/min   Anion gap 8 5 - 15  Urinalysis, Routine w reflex microscopic  Result Value Ref Range   Color, Urine YELLOW YELLOW   APPearance CLEAR CLEAR   Specific Gravity, Urine 1.020 1.005 - 1.030   pH 7.0 5.0 - 8.0   Glucose, UA NEGATIVE NEGATIVE mg/dL   Hgb urine dipstick TRACE (A) NEGATIVE   Bilirubin Urine NEGATIVE NEGATIVE   Ketones, ur NEGATIVE NEGATIVE mg/dL   Protein, ur NEGATIVE NEGATIVE mg/dL   Nitrite NEGATIVE NEGATIVE   Leukocytes,Ua NEGATIVE NEGATIVE  Urinalysis, Microscopic (reflex)  Result Value Ref Range   RBC / HPF 0-5 0 - 5 RBC/hpf   WBC, UA 0-5 0 - 5 WBC/hpf   Bacteria, UA FEW (A) NONE SEEN   Squamous Epithelial / LPF 0-5 0 - 5   Mucus PRESENT    Amorphous Crystal PRESENT    Ct Renal Stone Study  Result Date: 09/22/2018 CLINICAL DATA:  Right flank pain for 1 week EXAM: CT ABDOMEN AND PELVIS WITHOUT CONTRAST TECHNIQUE: Multidetector CT imaging of the abdomen and pelvis was performed following the standard protocol without IV contrast. COMPARISON:  CT abdomen pelvis 10/14/2017 FINDINGS: LOWER CHEST: There is no basilar pleural or apical pericardial effusion. HEPATOBILIARY: The hepatic contours and density are normal. There is no intra- or extrahepatic biliary dilatation. The gallbladder is normal. PANCREAS: The pancreatic parenchymal contours are normal and there is no ductal dilatation. There is no peripancreatic fluid collection. SPLEEN: Normal. ADRENALS/URINARY TRACT: --Adrenal glands: Normal. --Right kidney/ureter: No hydronephrosis, nephroureterolithiasis, perinephric stranding or solid renal mass. --Left kidney/ureter: No hydronephrosis, nephroureterolithiasis, perinephric stranding or solid renal mass. --Urinary bladder: Normal for degree of distention STOMACH/BOWEL: --Stomach/Duodenum: There is no hiatal hernia or other gastric abnormality. The  duodenal course and caliber are normal. --Small bowel: No dilatation or inflammation. --Colon: No focal abnormality. --Appendix: Normal. VASCULAR/LYMPHATIC: Normal course and caliber of the major abdominal vessels. No abdominal or pelvic lymphadenopathy. REPRODUCTIVE: No free fluid in the pelvis. MUSCULOSKELETAL. No bony spinal canal stenosis or focal osseous abnormality. OTHER: None. IMPRESSION: No obstructive uropathy or other acute abdominopelvic abnormality. Electronically Signed   By: Deatra RobinsonKevin  Herman M.D.   On: 09/22/2018 04:51    Radiology Ct Renal Stone Study  Result Date: 09/22/2018 CLINICAL DATA:  Right flank pain for 1 week EXAM: CT ABDOMEN AND PELVIS WITHOUT CONTRAST TECHNIQUE: Multidetector CT imaging of the abdomen and pelvis was performed following the standard protocol without IV contrast. COMPARISON:  CT abdomen pelvis 10/14/2017 FINDINGS: LOWER CHEST: There is no basilar pleural or apical pericardial effusion. HEPATOBILIARY: The hepatic contours and density are normal. There is no intra- or extrahepatic biliary dilatation. The gallbladder is normal. PANCREAS: The pancreatic parenchymal contours are normal and there is no ductal dilatation. There is no peripancreatic fluid collection. SPLEEN: Normal. ADRENALS/URINARY TRACT: --Adrenal glands: Normal. --Right kidney/ureter: No hydronephrosis, nephroureterolithiasis, perinephric stranding or solid renal mass. --Left kidney/ureter: No hydronephrosis, nephroureterolithiasis, perinephric stranding or solid renal mass. --Urinary bladder: Normal for degree of distention STOMACH/BOWEL: --Stomach/Duodenum: There is no hiatal hernia or other gastric abnormality. The duodenal course and caliber are normal. --Small bowel: No dilatation or inflammation. --Colon: No focal abnormality. --Appendix: Normal. VASCULAR/LYMPHATIC: Normal course and caliber of the major abdominal vessels. No abdominal or pelvic lymphadenopathy. REPRODUCTIVE: No free fluid in the  pelvis. MUSCULOSKELETAL. No bony spinal canal stenosis or focal osseous abnormality. OTHER: None. IMPRESSION: No obstructive uropathy or other acute abdominopelvic abnormality. Electronically Signed   By: Caryn BeeKevin  Chase PicketHerman M.D.   On: 09/22/2018 04:51    Procedures Procedures (including critical care time)  Medications Ordered in ED Medications  ketorolac (TORADOL) 30 MG/ML injection 15 mg (15 mg Intravenous Given 09/22/18 0438)  sodium chloride 0.9 % bolus 500 mL (0 mLs Intravenous Stopped 09/22/18 0508)  ondansetron (ZOFRAN) injection 4 mg (4 mg Intravenous Given 09/22/18 0437)  methocarbamol (ROBAXIN) tablet 1,000 mg (1,000 mg Oral Given 09/22/18 0511)     Likely MSK in nature.   Final Clinical Impressions(s) / ED Diagnoses   Final diagnoses:  Flank pain   Return for intractable cough, coughing up blood,fevers >100.4 unrelieved by medication, shortness of breath, intractable vomiting, chest pain, shortness of breath, weakness,numbness, changes in speech, facial asymmetry,abdominal pain, passing out,Inability to tolerate liquids or food, cough, altered mental status or any concerns. No signs of systemic illness or infection. The patient is nontoxic-appearing on exam and vital signs are within normal limits.   I have reviewed the triage vital signs and the nursing notes. Pertinent labs &imaging results that were available during my care of the patient were reviewed by me and considered in my medical decision making (see chart for details).  After history, exam, and medical workup I feel the patient has been appropriately medically screened and is safe for discharge home. Pertinent diagnoses were discussed with the patient. Patient was given return precautions ED Discharge Orders         Ordered    Diclofenac Sodium CR 100 MG 24 hr tablet  Daily     09/22/18 0509    lidocaine (LIDODERM) 5 %  Every 24 hours     09/22/18 0509    methocarbamol (ROBAXIN) 500 MG tablet  2 times daily      09/22/18 0510           Glorie Dowlen, MD 09/22/18 [id number]

## 2019-04-03 ENCOUNTER — Ambulatory Visit: Payer: Medicaid Other | Admitting: Physical Therapy

## 2019-04-22 ENCOUNTER — Encounter: Payer: Self-pay | Admitting: Physical Therapy

## 2019-04-22 ENCOUNTER — Ambulatory Visit: Payer: Medicaid Other | Attending: Orthopaedic Surgery | Admitting: Physical Therapy

## 2019-04-22 ENCOUNTER — Other Ambulatory Visit: Payer: Self-pay

## 2019-04-22 DIAGNOSIS — R262 Difficulty in walking, not elsewhere classified: Secondary | ICD-10-CM

## 2019-04-22 DIAGNOSIS — R600 Localized edema: Secondary | ICD-10-CM | POA: Insufficient documentation

## 2019-04-22 DIAGNOSIS — M25661 Stiffness of right knee, not elsewhere classified: Secondary | ICD-10-CM

## 2019-04-22 DIAGNOSIS — M25561 Pain in right knee: Secondary | ICD-10-CM | POA: Insufficient documentation

## 2019-04-22 NOTE — Therapy (Signed)
[facility] [place] [address] [place], [place], [address] Phone: [phone]   Fax:  [phone]  Physical Therapy Evaluation  Patient Details  Name: Daniel Solis MRN: [id number] Date of Birth: 1975/12/21 Referring Provider (PT): Jamelle Haring B. Daws   Encounter Date: 04/22/2019  PT End of Session - 04/22/19 0951    Visit Number  1    Number of Visits  4    Date for PT Re-Evaluation  05/23/19    Authorization Type  Medicaid will limit visits    PT Start Time  0924    PT Stop Time  1018    PT Time Calculation (min)  54 min    Activity Tolerance  Patient tolerated treatment well;Patient limited by pain    Behavior During Therapy  [facility] for tasks assessed/performed       Past Medical History:  Diagnosis Date  . Anxiety   . Attention deficit disorder   . Cigarette smoker   . Hepatitis C   . Hypertension   . TIA (transient ischemic attack)     Past Surgical History:  Procedure Laterality Date  . ARM DEBRIDEMENT      There were no vitals filed for this visit.   Subjective Assessment - 04/22/19 0928    Subjective  Patient reporta that early December he rolled his right ankle and fell, he reports that his ankle was very bad, he reports that the ankle has improved but has right knee pain and instability. MRI showed a suspected small ACL tear, and meniscus tear and joint effusion.  He was in a boot, for a period of time now in a knee brace    Patient Stated Goals  have less pain, feel more stable    Currently in Pain?  Yes    Pain Score  2     Pain Location  Knee    Pain Orientation  Right    Pain Descriptors / Indicators  Sharp;Shooting    Pain Type  Acute pain    Pain Onset  More than a month ago    Pain Frequency  Constant    Aggravating Factors   sleeping, walking, bending, straightening pain up to 9/10, all can cause a shooting pain    Pain Relieving Factors  rest and not doing things helps some    Effect of Pain on  Daily Activities  not doing normal job, difficulty sleeping, walking         [facility] PT Assessment - 04/22/19 0001      Assessment   Medical Diagnosis  right knee pain    Referring Provider (PT)  Jamelle Haring B. Daws    Onset Date/Surgical Date  02/27/19    Prior Therapy  no      Precautions   Precautions  None      Balance Screen   Has the patient fallen in the past 6 months  Yes    How many times?  1    Has the patient had a decrease in activity level because of a fear of falling?   No    Is the patient reluctant to leave their home because of a fear of falling?   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#, has to crawl    Leisure  no exercise      ROM / Strength   AROM / PROM / Strength  AROM;Strength      AROM   Overall AROM Comments  pain with flexion and extension    AROM Assessment Site  Knee    Right/Left Knee  Right    Right Knee Extension  14    Right Knee Flexion  95      Strength   Overall Strength Comments  pain a 6/10 with flexion, 5/10 with extension    Strength Assessment Site  Knee    Right/Left Knee  Right    Right Knee Flexion  4-/5    Right Knee Extension  4-/5      Palpation   Palpation comment  mild tenderness along the joint line      Ambulation/Gait   Gait Comments  uses a brace on the right, antalgic on the right                Objective measurements completed on examination: See above findings.      [facility] Adult PT Treatment/Exercise - 04/22/19 0001      Modalities   Modalities  Cryotherapy;Electrical Stimulation      Cryotherapy   Number Minutes Cryotherapy  12 Minutes    Cryotherapy Location  Knee    Type of Cryotherapy  Ice pack      Electrical Stimulation   Electrical Stimulation Location  right knee    Electrical  Stimulation Action  IFC    Electrical Stimulation Parameters  supine with elevation    Electrical Stimulation Goals  Pain;Edema                  PT Long Term Goals - 04/22/19 1006      PT LONG TERM GOAL #1   Title  independent with HEP    Time  12    Period  Weeks    Status  New      PT LONG TERM GOAL #2   Title  decrease pain with activity 50%    Time  12    Period  Weeks    Status  New      PT LONG TERM GOAL #3   Title  increase AROM to WFL's    Time  12    Period  Weeks    Status  New      PT LONG TERM GOAL #4   Title  increase sterngth to 4+/5    Time  12    Period  Weeks    Status  New      PT LONG TERM GOAL #5   Title  climb ladder without difficulty    Time  12    Period  Weeks    Status  New             Plan - 04/22/19 0951    Clinical Impression Statement  Patient twisted his rigth ankle an dknee in early December when he fell.  The ankle was very painful but since then the ankle ha improved and the knee is the biggest issue, MRI showed a possible ACL tear, and he does have a meniscal tear and joint effusion.  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  Has limitation in ROM and  strength.  Very painful  with motions    Stability/Clinical Decision Making  Evolving/Moderate complexity    Clinical Decision Making  Moderate    Rehab Potential  Good    PT Frequency  1x / week    PT Duration  12 weeks    PT Treatment/Interventions  ADLs/Self Care Home Management;Electrical Stimulation;Iontophoresis 4mg /ml Dexamethasone;Cryotherapy;Moist Heat;Ultrasound;Gait training;Stair training;Functional mobility training;Balance training;Therapeutic exercise;Therapeutic activities;Patient/family education;Manual techniques;Vasopneumatic Device    PT Next Visit Plan  start gym activities to help woth ROM, strength, function and stability    Consulted  and Agree with Plan of Care  Patient       Patient will benefit from skilled therapeutic intervention in order to improve the following deficits and impairments:  Abnormal gait, Pain, Decreased mobility, Decreased activity tolerance, Decreased range of motion, Decreased strength, Decreased balance, Difficulty walking, Increased edema, Impaired flexibility  Visit Diagnosis: Acute pain of right knee - Plan: PT plan of care cert/re-cert  Stiffness of right knee, not elsewhere classified - Plan: PT plan of care cert/re-cert  Difficulty in walking, not elsewhere classified - Plan: PT plan of care cert/re-cert  Localized edema - Plan: PT plan of care cert/re-cert     Problem List Patient Active Problem List   Diagnosis Date Noted  . Paresthesia 02/04/2016  . Smoker 02/13/2011  . Left hemiparesis (HCC) 02/13/2011  . 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 [place], [place], [place] Phone: [phone]   Fax:  [phone]  Name: Daton Szilagyi MRN: Marguarite Arbour Date of Birth: [birth year]

## 2019-04-22 NOTE — Patient Instructions (Signed)
Access Code: [email or web address]  URL: [email or web address]  Date: 04/22/2019  Prepared by: Stacie Glaze   Exercises Supine Short Arc Quad - 10 reps - 2 sets - 2 hold - 2x daily - 7x weekly Standing Knee Flexion - 10 reps - 2 sets - 2 hold - 2x daily - 7x weekly

## 2019-04-30 ENCOUNTER — Encounter: Payer: Self-pay | Admitting: Physical Therapy

## 2019-04-30 ENCOUNTER — Other Ambulatory Visit: Payer: Self-pay

## 2019-04-30 ENCOUNTER — Ambulatory Visit: Payer: Medicaid Other | Attending: Orthopaedic Surgery | Admitting: Physical Therapy

## 2019-04-30 DIAGNOSIS — M25661 Stiffness of right knee, not elsewhere classified: Secondary | ICD-10-CM | POA: Diagnosis not present

## 2019-04-30 DIAGNOSIS — R600 Localized edema: Secondary | ICD-10-CM | POA: Insufficient documentation

## 2019-04-30 DIAGNOSIS — R262 Difficulty in walking, not elsewhere classified: Secondary | ICD-10-CM | POA: Insufficient documentation

## 2019-04-30 DIAGNOSIS — M25561 Pain in right knee: Secondary | ICD-10-CM | POA: Diagnosis present

## 2019-04-30 NOTE — Therapy (Signed)
[facility] [place] [address] [place], [place], [address] Phone: [phone]   Fax:  [phone]  Physical Therapy Treatment  Patient Details  Name: Daniel Solis MRN: [id number] Date of Birth: [birth year] Referring Provider (PT): Jamelle Haring B. Daws   Encounter Date: 04/30/2019  PT End of Session - 04/30/19 1010    Visit Number  2    Date for PT Re-Evaluation  05/23/19    Authorization Type  Medicaid will limit visits    PT Start Time  0930    PT Stop Time  1023    PT Time Calculation (min)  53 min    Activity Tolerance  Patient tolerated treatment well;Patient limited by pain    Behavior During Therapy  [facility] for tasks assessed/performed       Past Medical History:  Diagnosis Date  . Anxiety   . Attention deficit disorder   . Cigarette smoker   . Hepatitis C   . Hypertension   . TIA (transient ischemic attack)     Past Surgical History:  Procedure Laterality Date  . ARM DEBRIDEMENT      There were no vitals filed for this visit.  Subjective Assessment - 04/30/19 0929    Subjective  Pt reports that he may have over did it in the yard yesterday.  Changed out a bumper    Currently in Pain?  Yes    Pain Score  2     Pain Location  Knee                       [facility] Adult PT Treatment/Exercise - 04/30/19 0001      Exercises   Exercises  Knee/Hip      Knee/Hip Exercises: Aerobic   Nustep  L3 x 6 min       Knee/Hip Exercises: Seated   Long Arc Quad  Right;2 sets;10 reps;Strengthening    Other Seated Knee/Hip Exercises  Fitter pressed 1 balck & bllue 2x10     Hamstring Curl  Right;2 sets;10 reps    Hamstring Limitations  yellow tband    Sit to Sand  3 sets;5 reps;without UE support   elevated UBE seat      Knee/Hip Exercises: Supine   Short Arc Quad Sets  Right;2 sets;10 reps      Modalities   Modalities  Cryotherapy;Electrical Stimulation      Cryotherapy   Number Minutes Cryotherapy  12  Minutes    Cryotherapy Location  Knee    Type of Cryotherapy  Ice pack      Electrical Stimulation   Electrical Stimulation Location  right knee    Electrical Stimulation Action  supine     Electrical Stimulation Goals  Pain;Edema      Manual Therapy   Manual Therapy  Passive ROM    Passive ROM  R knee flex and ext                  PT Long Term Goals - 04/22/19 1006      PT LONG TERM GOAL #1   Title  independent with HEP    Time  12    Period  Weeks    Status  New      PT LONG TERM GOAL #2   Title  decrease pain with activity 50%    Time  12    Period  Weeks    Status  New  PT LONG TERM GOAL #3   Title  increase AROM to WFL's    Time  12    Period  Weeks    Status  New      PT LONG TERM GOAL #4   Title  increase sterngth to 4+/5    Time  12    Period  Weeks    Status  New      PT LONG TERM GOAL #5   Title  climb ladder without difficulty    Time  12    Period  Weeks    Status  New            Plan - 04/30/19 1011    Clinical Impression Statement  Pt report tolerable [place] throughout session. He was very guarded with MT. Reports concerns about not being able to straighten his knee all the way. Initial hesitation with all exercises, but did better all reps progressed.    Stability/Clinical Decision Making  Evolving/Moderate complexity    Rehab Potential  Good    PT Frequency  1x / week    PT Duration  12 weeks    PT Treatment/Interventions  ADLs/Self Care Home Management;Electrical Stimulation;Iontophoresis 4mg /ml Dexamethasone;Cryotherapy;Moist Heat;Ultrasound;Gait training;Stair training;Functional mobility training;Balance training;Therapeutic exercise;Therapeutic activities;Patient/family education;Manual techniques;Vasopneumatic Device    PT Next Visit Plan  activities to help with ROM, strength, function and stability       Patient will benefit from skilled therapeutic intervention in order to improve the following deficits and  impairments:  Abnormal gait, Pain, Decreased mobility, Decreased activity tolerance, Decreased range of motion, Decreased strength, Decreased balance, Difficulty walking, Increased edema, Impaired flexibility  Visit Diagnosis: Stiffness of right knee, not elsewhere classified  Difficulty in walking, not elsewhere classified  Localized edema  Acute pain of right knee     Problem List Patient Active Problem List   Diagnosis Date Noted  . Paresthesia 02/04/2016  . Smoker 02/13/2011  . Left hemiparesis ([place]) 02/13/2011  . 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

## 2019-05-06 ENCOUNTER — Encounter: Payer: Self-pay | Admitting: Physical Therapy

## 2019-05-06 ENCOUNTER — Ambulatory Visit: Payer: Medicaid Other | Admitting: Physical Therapy

## 2019-05-06 ENCOUNTER — Other Ambulatory Visit: Payer: Self-pay

## 2019-05-06 DIAGNOSIS — R600 Localized edema: Secondary | ICD-10-CM

## 2019-05-06 DIAGNOSIS — M25661 Stiffness of right knee, not elsewhere classified: Secondary | ICD-10-CM

## 2019-05-06 DIAGNOSIS — R262 Difficulty in walking, not elsewhere classified: Secondary | ICD-10-CM

## 2019-05-06 DIAGNOSIS — M25561 Pain in right knee: Secondary | ICD-10-CM

## 2019-05-06 NOTE — Therapy (Signed)
[place], [place], [address] Phone: [phone]   Fax:  [phone]  Physical Therapy Treatment  Patient Details  Name: Daniel Solis MRN: [id number] Date of Birth: September 27, 1975 Referring Provider (PT): Emogene Morgan B. Daws   Encounter Date: 05/06/2019  PT End of Session - 05/06/19 1039    Visit Number  3    Date for PT Re-Evaluation  05/23/19    Authorization Type  Medicaid will limit visits    PT Start Time  1011    PT Stop Time  1052    PT Time Calculation (min)  41 min    Activity Tolerance  Patient limited by pain    Behavior During Therapy  [facility] for tasks assessed/performed       Past Medical History:  Diagnosis Date  . Anxiety   . Attention deficit disorder   . Cigarette smoker   . Hepatitis C   . Hypertension   . TIA (transient ischemic attack)     Past Surgical History:  Procedure Laterality Date  . ARM DEBRIDEMENT      There were no vitals filed for this visit.  Subjective Assessment - 05/06/19 1013    Subjective  Pt enters clinic reporting increase R pain that started Sunday    Currently in Pain?  Yes    Pain Score  7     Pain Location  Knee    Pain Orientation  Right                       [facility] Adult PT Treatment/Exercise - 05/06/19 0001      Knee/Hip Exercises: Aerobic   Nustep  L2 x 6 min       Knee/Hip Exercises: Seated   Long Arc Quad  Right;2 sets;10 reps;Strengthening    Hamstring Curl  Right;2 sets;10 reps    Hamstring Limitations  red tband       Knee/Hip Exercises: Supine   Short Arc Quad Sets  Right;2 sets;10 reps      Cryotherapy   Number Minutes Cryotherapy  15 Minutes    Cryotherapy Location  Knee    Type of Cryotherapy  Ice pack      Electrical Stimulation   Electrical Stimulation Location  right knee    Electrical Stimulation Action  IFC    Electrical Stimulation Parameters  To tolerance    Electrical Stimulation Goals  Pain;Edema       Manual Therapy   Manual Therapy  Passive ROM    Passive ROM  R knee flex and ext                  PT Long Term Goals - 04/22/19 1006      PT LONG TERM GOAL #1   Title  independent with HEP    Time  12    Period  Weeks    Status  New      PT LONG TERM GOAL #2   Title  decrease pain with activity 50%    Time  12    Period  Weeks    Status  New      PT LONG TERM GOAL #3   Title  increase AROM to WFL's    Time  12    Period  Weeks    Status  New      PT LONG TERM GOAL #4   Title  increase sterngth to 4+/5  Time  12    Period  Weeks    Status  New      PT LONG TERM GOAL #5   Title  climb ladder without difficulty    Time  12    Period  Weeks    Status  New            Plan - 05/06/19 1041    Clinical Impression Statement  Pt was limited by pain today. He enters clinic reporting increase R knee pain with flexion and ext. Sitting pt R knee rest at ~ 60 degrees flexion, was able to get it past 90 with some PROM. Pain reported with all interventions. Positive response to modalities.    Stability/Clinical Decision Making  Evolving/Moderate complexity    Rehab Potential  Good    PT Frequency  1x / week    PT Duration  12 weeks    PT Treatment/Interventions  ADLs/Self Care Home Management;Electrical Stimulation;Iontophoresis 4mg /ml Dexamethasone;Cryotherapy;Moist Heat;Ultrasound;Gait training;Stair training;Functional mobility training;Balance training;Therapeutic exercise;Therapeutic activities;Patient/family education;Manual techniques;Vasopneumatic Device    PT Next Visit Plan  activities to help woth ROM, strength, function and stability       Patient will benefit from skilled therapeutic intervention in order to improve the following deficits and impairments:  Abnormal gait, Pain, Decreased mobility, Decreased activity tolerance, Decreased range of motion, Decreased strength, Decreased balance, Difficulty walking, Increased edema, Impaired  flexibility  Visit Diagnosis: Difficulty in walking, not elsewhere classified  Stiffness of right knee, not elsewhere classified  Localized edema  Acute pain of right knee     Problem List Patient Active Problem List   Diagnosis Date Noted  . Paresthesia 02/04/2016  . Smoker 02/13/2011  . Left hemiparesis (HCC) 02/13/2011  . Hemiplegia and hemiparesis 02/11/2011    02/13/2011, PTA 05/06/2019, 10:44 AM  [facility] [address] [facility] [address] [place], [place], [place] Phone: [phone]   Fax:  [phone]  Name: Nicklous Aburto MRN: Marguarite Arbour Date of Birth: 26-Aug-1975

## 2019-05-15 ENCOUNTER — Ambulatory Visit: Payer: Medicaid Other | Admitting: Physical Therapy

## 2019-05-21 ENCOUNTER — Other Ambulatory Visit: Payer: Self-pay

## 2019-05-21 ENCOUNTER — Ambulatory Visit: Payer: Medicaid Other | Admitting: Physical Therapy

## 2019-05-21 ENCOUNTER — Encounter: Payer: Self-pay | Admitting: Physical Therapy

## 2019-05-21 DIAGNOSIS — R262 Difficulty in walking, not elsewhere classified: Secondary | ICD-10-CM

## 2019-05-21 DIAGNOSIS — M25661 Stiffness of right knee, not elsewhere classified: Secondary | ICD-10-CM | POA: Diagnosis not present

## 2019-05-21 DIAGNOSIS — R600 Localized edema: Secondary | ICD-10-CM

## 2019-05-21 DIAGNOSIS — M25561 Pain in right knee: Secondary | ICD-10-CM

## 2019-05-21 NOTE — Therapy (Signed)
[place], [place], [address] Phone: [phone]   Fax:  [phone]  Physical Therapy Treatment  Patient Details  Name: Daniel Solis MRN: [id number] Date of Birth: 08/15/1975 Referring Provider (PT): Emogene Morgan B. Daws   Encounter Date: 05/21/2019  PT End of Session - 05/21/19 1007    Visit Number  4    Number of Visits  4    Authorization Type  Medicaid will limit visits    PT Start Time  0840    PT Stop Time  0932    PT Time Calculation (min)  52 min    Activity Tolerance  Patient limited by pain    Behavior During Therapy  [facility] for tasks assessed/performed       Past Medical History:  Diagnosis Date  . Anxiety   . Attention deficit disorder   . Cigarette smoker   . Hepatitis C   . Hypertension   . TIA (transient ischemic attack)     Past Surgical History:  Procedure Laterality Date  . ARM DEBRIDEMENT      There were no vitals filed for this visit.  Subjective Assessment - 05/21/19 0851    Subjective  Patient at the last treatment had much increased pain, he is unsure why, he reports that he saw the MD, was told to continue and may need surgery.  He still has less pain but still limited some    Currently in Pain?  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    Long [employer] Limitations  cues for [employer]      Cryotherapy   Number Minutes Cryotherapy  10 Minutes    Cryotherapy Location  Knee    Type of Cryotherapy  Ice pack      Electrical Stimulation   Electrical Stimulation Location  right knee    Electrical Stimulation Action  IFC    Electrical Stimulation Parameters  elevated    Electrical Stimulation Goals  Pain;Edema                  PT Long Term  Goals - 05/21/19 1010      PT LONG TERM GOAL #1   Title  independent with HEP    Status  Partially Met      PT LONG TERM GOAL #2   Title  decrease pain with activity 50%    Status  On-going      PT LONG TERM GOAL #3   Title  increase AROM to WFL's    Status  On-going      PT LONG TERM GOAL #4   Title  increase sterngth to 4+/5    Status  On-going      PT LONG TERM GOAL #5   Title  climb ladder without difficulty    Status  Partially Met            Plan - 05/21/19 1009    Clinical Impression Statement  Patient has had the eval and 3 visits, he had a set back last week, after climbing on a roof, he came in with increaesd swelling and very stiff and He is walking better and was able to do stairs step over step and a ladder today step over step.    PT Next Visit Plan  activities to help woth ROM, strength, function and stability    Consulted and Agree with Plan of Care  Patient       Patient will benefit from skilled therapeutic intervention in order to improve the following deficits and impairments:  Abnormal gait, Pain, Decreased mobility, Decreased activity tolerance, Decreased range of motion, Decreased strength, Decreased balance, Difficulty walking, Increased edema, Impaired  flexibility  Visit Diagnosis: Difficulty in walking, not elsewhere classified  Stiffness of right knee, not elsewhere classified  Localized edema  Acute pain of right knee     Problem List Patient Active Problem List   Diagnosis Date Noted  . Paresthesia 02/04/2016  . Smoker 02/13/2011  . Left hemiparesis ([place]) 02/13/2011  . Hemiplegia and hemiparesis 02/11/2011    Sumner Boast., PT 05/21/2019, 10:11 AM  [place] [address] [place], [place], [address] Phone: [phone]   Fax:  [phone]  Name: Daniel Solis MRN: [id number] Date of Birth: [birth year]

## 2019-05-30 ENCOUNTER — Other Ambulatory Visit: Payer: Self-pay

## 2019-05-30 ENCOUNTER — Ambulatory Visit: Payer: Medicaid Other | Attending: Orthopaedic Surgery | Admitting: Physical Therapy

## 2019-05-30 ENCOUNTER — Encounter: Payer: Self-pay | Admitting: Physical Therapy

## 2019-05-30 DIAGNOSIS — R262 Difficulty in walking, not elsewhere classified: Secondary | ICD-10-CM | POA: Diagnosis not present

## 2019-05-30 DIAGNOSIS — R600 Localized edema: Secondary | ICD-10-CM | POA: Diagnosis present

## 2019-05-30 DIAGNOSIS — M25661 Stiffness of right knee, not elsewhere classified: Secondary | ICD-10-CM | POA: Insufficient documentation

## 2019-05-30 DIAGNOSIS — M25561 Pain in right knee: Secondary | ICD-10-CM | POA: Insufficient documentation

## 2019-05-30 NOTE — Therapy (Signed)
[place], [place], [address] Phone: [phone]   Fax:  [phone]  Physical Therapy Treatment  Patient Details  Name: Daniel Solis MRN: [id number] Date of Birth: 05/09/1975 Referring Provider (PT): Emogene Morgan B. Daws   Encounter Date: 05/30/2019  PT End of Session - 05/30/19 1053    Visit Number  5    Number of Visits  12    Authorization Type  Medicaid will limit visits    PT Start Time  1011    PT Stop Time  1108    PT Time Calculation (min)  57 min    Activity Tolerance  Patient limited by pain       Past Medical History:  Diagnosis Date  . Anxiety   . Attention deficit disorder   . Cigarette smoker   . Hepatitis C   . Hypertension   . TIA (transient ischemic attack)     Past Surgical History:  Procedure Laterality Date  . ARM DEBRIDEMENT      There were no vitals filed for this visit.  Subjective Assessment - 05/30/19 1012    Subjective  Patient reports that he has been very busy, hurting but still trying to do everything    Currently in Pain?  Yes    Pain Score  4     Pain Location  Knee    Pain Orientation  Right    Pain Descriptors / Indicators  Aching    Aggravating Factors   activity                       [facility] Adult PT Treatment/Exercise - 05/30/19 0001      Knee/Hip Exercises: Aerobic   Recumbent Bike  x 6 minutes, slow    Nustep  L2 x 6 min       Knee/Hip Exercises: Machines for Strengthening   Cybex Knee Extension  5# 2x10 cues for TKE    Cybex Knee Flexion  20# 2x10    Cybex Leg Press  20# 2x10      Knee/Hip Exercises: Standing   Walking with Sports Cord  all directions      Knee/Hip Exercises: Supine   Short Arc Quad Sets  Right;2 sets;10 reps      Cryotherapy   Number Minutes Cryotherapy  10 Minutes    Cryotherapy Location  Knee    Type of Cryotherapy  Ice pack      Electrical Stimulation   Electrical Stimulation Location  right  knee    Electrical Stimulation Action  IFC    Electrical Stimulation Parameters  elevated    Electrical Stimulation Goals  Pain;Edema                  PT Long Term Goals - 05/30/19 1055      PT LONG TERM GOAL #1   Title  independent with HEP    Status  Partially Met      PT LONG TERM GOAL #2   Title  decrease pain with activity 50%    Status  On-going      PT LONG TERM GOAL #3   Title  increase AROM to WFL's    Status  On-going            Plan - 05/30/19 1054    Clinical Impression Statement  Patient is moving faster and more agile, he is still timid with some motions and has limited  flexion demonstrated by not being able to lift the right leg over the bike getting in on that side.  He continues to c/o significant pain at times with extension and bending    PT Next Visit Plan  activities to help woth ROM, strength, function and stability    Consulted and Agree with Plan of Care  Patient       Patient will benefit from skilled therapeutic intervention in order to improve the following deficits and impairments:  Abnormal gait, Pain, Decreased mobility, Decreased activity tolerance, Decreased range of motion, Decreased strength, Decreased balance, Difficulty walking, Increased edema, Impaired flexibility  Visit Diagnosis: Difficulty in walking, not elsewhere classified  Stiffness of right knee, not elsewhere classified  Localized edema     Problem List Patient Active Problem List   Diagnosis Date Noted  . Paresthesia 02/04/2016  . Smoker 02/13/2011  . Left hemiparesis ([place]) 02/13/2011  . Hemiplegia and hemiparesis 02/11/2011    Sumner Boast., PT 05/30/2019, 11:03 AM  [place], [place], [address] Phone: [phone]   Fax:  [phone]  Name: Newell Wafer MRN: [id number] Date of Birth: 23-Jul-1975

## 2019-06-03 ENCOUNTER — Ambulatory Visit: Payer: Medicaid Other | Admitting: Physical Therapy

## 2019-06-11 ENCOUNTER — Encounter: Payer: Self-pay | Admitting: Physical Therapy

## 2019-06-11 ENCOUNTER — Other Ambulatory Visit: Payer: Self-pay

## 2019-06-11 ENCOUNTER — Ambulatory Visit: Payer: Medicaid Other | Admitting: Physical Therapy

## 2019-06-11 DIAGNOSIS — R600 Localized edema: Secondary | ICD-10-CM

## 2019-06-11 DIAGNOSIS — R262 Difficulty in walking, not elsewhere classified: Secondary | ICD-10-CM | POA: Diagnosis not present

## 2019-06-11 DIAGNOSIS — M25661 Stiffness of right knee, not elsewhere classified: Secondary | ICD-10-CM

## 2019-06-11 DIAGNOSIS — M25561 Pain in right knee: Secondary | ICD-10-CM

## 2019-06-11 NOTE — Therapy (Signed)
[place], [place], [address] Phone: [phone]   Fax:  [phone]  Physical Therapy Treatment  Patient Details  Name: Daniel Solis MRN: [id number] Date of Birth: 07/17/1975 Referring Provider (PT): Emogene Morgan B. Daws   Encounter Date: 06/11/2019  PT End of Session - 06/11/19 0923    Visit Number  6    Date for PT Re-Evaluation  05/23/19    Authorization Type  Medicaid will limit visits    PT Start Time  0845    PT Stop Time  0935    PT Time Calculation (min)  50 min    Activity Tolerance  Patient limited by pain    Behavior During Therapy  [facility] for tasks assessed/performed       Past Medical History:  Diagnosis Date  . Anxiety   . Attention deficit disorder   . Cigarette smoker   . Hepatitis C   . Hypertension   . TIA (transient ischemic attack)     Past Surgical History:  Procedure Laterality Date  . ARM DEBRIDEMENT      There were no vitals filed for this visit.  Subjective Assessment - 06/11/19 0849    Subjective  "I am getting around a little better"    Currently in Pain?  Yes    Pain Score  2     Pain Location  Knee    Pain Orientation  Right                       [facility] Adult PT Treatment/Exercise - 06/11/19 0001      Knee/Hip Exercises: Aerobic   Recumbent Bike  x 4 minutes, slow    Nustep  L1 LE only x4 min       Knee/Hip Exercises: Machines for Strengthening   Cybex Knee Extension  5# 2x10 cues for TKE    Cybex Knee Flexion  25# 2x10, RLE 15lb 2x10     Cybex Leg Press  20# 2x15, RLE TKE 20lb 3x5       Knee/Hip Exercises: Standing   Forward Step Up  Right;2 sets;10 reps;Hand Hold: 0;Step Height: 6"      Knee/Hip Exercises: Seated   Long Arc Quad  Right;2 sets;10 reps;Strengthening    Long Arc Quad Weight  3 lbs.    Long Arc [employer] Limitations  cues for [employer]      Knee/Hip Exercises: Supine   Short Arc [employer]  Right;2 sets;15 reps    Short Arc  [employer] Limitations  3      Cryotherapy   Number Minutes Cryotherapy  10 Minutes    Cryotherapy Location  Knee    Type of Cryotherapy  Ice pack                  PT Long Term Goals - 05/30/19 1055      PT LONG TERM GOAL #1   Title  independent with HEP    Status  Partially Met      PT LONG TERM GOAL #2   Title  decrease pain with activity 50%    Status  On-going      PT LONG TERM GOAL #3   Title  increase AROM to WFL's    Status  On-going            Plan - 06/11/19 0924    Clinical Impression Statement  Pt enters clinic reporting better mobility. Some  pain reported with activity on the lateral aspect or R knee. Cues given throughout for TKE with all exercises. He was able to complete RLE TKE strengthening on leg press with little pain. Increase fatigue with step ups.    Stability/Clinical Decision Making  Evolving/Moderate complexity    Rehab Potential  Good    PT Frequency  1x / week    PT Duration  12 weeks    PT Treatment/Interventions  ADLs/Self Care Home Management;Electrical Stimulation;Iontophoresis '4mg'$ /ml Dexamethasone;Cryotherapy;Moist Heat;Ultrasound;Gait training;Stair training;Functional mobility training;Balance training;Therapeutic exercise;Therapeutic activities;Patient/family education;Manual techniques;Vasopneumatic Device    PT Next Visit Plan  activities to help woth ROM, strength, function and stability       Patient will benefit from skilled therapeutic intervention in order to improve the following deficits and impairments:  Abnormal gait, Pain, Decreased mobility, Decreased activity tolerance, Decreased range of motion, Decreased strength, Decreased balance, Difficulty walking, Increased edema, Impaired flexibility  Visit Diagnosis: Difficulty in walking, not elsewhere classified  Localized edema  Acute pain of right knee  Stiffness of right knee, not elsewhere classified     Problem List Patient Active Problem List    Diagnosis Date Noted  . Paresthesia 02/04/2016  . Smoker 02/13/2011  . Left hemiparesis ([place]) 02/13/2011  . Hemiplegia and hemiparesis 02/11/2011    Scot Jun, PTA 06/11/2019, 9:27 AM  [place], [place], [address] Phone: [phone]   Fax:  [phone]  Name: Daniel Solis MRN: [id number] Date of Birth: 1976-02-12

## 2019-06-18 ENCOUNTER — Ambulatory Visit: Payer: Medicaid Other | Admitting: Physical Therapy

## 2019-06-18 ENCOUNTER — Encounter: Payer: Self-pay | Admitting: Physical Therapy

## 2019-06-18 ENCOUNTER — Other Ambulatory Visit: Payer: Self-pay

## 2019-06-18 DIAGNOSIS — R262 Difficulty in walking, not elsewhere classified: Secondary | ICD-10-CM | POA: Diagnosis not present

## 2019-06-18 DIAGNOSIS — M25661 Stiffness of right knee, not elsewhere classified: Secondary | ICD-10-CM

## 2019-06-18 DIAGNOSIS — M25561 Pain in right knee: Secondary | ICD-10-CM

## 2019-06-18 NOTE — Therapy (Signed)
[place], [place], [address] Phone: [phone]   Fax:  [phone]  Physical Therapy Treatment  Patient Details  Name: Daniel Solis MRN: [id number] Date of Birth: 1975-10-28 Referring Provider (PT): Emogene Morgan B. Daws   Encounter Date: 06/18/2019  PT End of Session - 06/18/19 1152    Visit Number  7    Number of Visits  12    Date for PT Re-Evaluation  07/22/19    PT Start Time  0928    PT Stop Time  1015    PT Time Calculation (min)  47 min    Activity Tolerance  Patient tolerated treatment well    Behavior During Therapy  Anxious       Past Medical History:  Diagnosis Date  . Anxiety   . Attention deficit disorder   . Cigarette smoker   . Hepatitis C   . Hypertension   . TIA (transient ischemic attack)     Past Surgical History:  Procedure Laterality Date  . ARM DEBRIDEMENT      There were no vitals filed for this visit.  Subjective Assessment - 06/18/19 0931    Subjective  I am doing okay, still have not "stressed it much"    Currently in Pain?  Yes    Pain Score  2     Pain Location  Knee    Pain Orientation  Right    Aggravating Factors   twisting                       [facility] Adult PT Treatment/Exercise - 06/18/19 0001      Knee/Hip Exercises: Stretches   Passive Hamstring Stretch  Right;3 reps;20 seconds    Gastroc Stretch  Both;3 reps;20 seconds      Knee/Hip Exercises: Aerobic   Recumbent Bike  x 4 minutes, slow    Nustep  L3 LE only x4 min       Knee/Hip Exercises: Machines for Strengthening   Cybex Knee Extension  10# 2x10    Cybex Knee Flexion  25# 2x10, RLE 15lb 2x10     Cybex Leg Press  20# 2x15, RLE TKE 20lb 3x5       Knee/Hip Exercises: Standing   Forward Step Up  Right;2 sets;10 reps;Hand Hold: 0;Step Height: 6"    Walking with Sports Cord  all directions      Knee/Hip Exercises: Supine   Short Arc Quad Sets  Right;2 sets;15 reps    Short Arc Quad Sets Limitations  5    Bridges with Ball Squeeze  20 reps    Straight Leg Raise with External Rotation  Right;2 sets;10 reps    Other Supine Knee/Hip Exercises  isometric abs 2x10 with ball                   PT Long Term Goals - 06/18/19 1154      PT LONG TERM GOAL #1   Title  independent with HEP    Status  Partially Met      PT LONG TERM GOAL #2   Title  decrease pain with activity 50%    Status  Partially Met      PT LONG TERM GOAL #3   Title  increase AROM to WFL's    Status  Partially Met            Plan - 06/18/19 1153    Clinical Impression  Statement  Patient reports some anxiety with 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  He is moving better, demonstrates better ROM but really still has pain in the lateral patellar area.  He was able to squat and come up today wihtout difficulty    PT Next Visit Plan  activities to help woth ROM, strength, function and stability       Patient will benefit from skilled therapeutic intervention in order to improve the following deficits and impairments:  Abnormal gait, Pain, Decreased mobility, Decreased activity tolerance, Decreased range of motion, Decreased strength, Decreased balance, Difficulty walking, Increased edema, Impaired flexibility  Visit Diagnosis: Difficulty in walking, not elsewhere classified  Acute pain of right knee  Stiffness of right knee, not elsewhere classified     Problem List Patient Active Problem List   Diagnosis Date Noted  . Paresthesia 02/04/2016  . Smoker 02/13/2011  . Left hemiparesis ([place]) 02/13/2011  . Hemiplegia and hemiparesis 02/11/2011    Sumner Boast., PT 06/18/2019, 11:55 AM  [place] [address] [place], [place], [address] Phone: [phone]   Fax:  [phone]  Name: Daniel Solis MRN: [id number] Date of Birth: Mar 22, 1976

## 2019-06-25 ENCOUNTER — Encounter: Payer: Self-pay | Admitting: Physical Therapy

## 2019-06-25 ENCOUNTER — Other Ambulatory Visit: Payer: Self-pay

## 2019-06-25 ENCOUNTER — Ambulatory Visit: Payer: Medicaid Other | Admitting: Physical Therapy

## 2019-06-25 DIAGNOSIS — R262 Difficulty in walking, not elsewhere classified: Secondary | ICD-10-CM

## 2019-06-25 DIAGNOSIS — M25661 Stiffness of right knee, not elsewhere classified: Secondary | ICD-10-CM

## 2019-06-25 DIAGNOSIS — R600 Localized edema: Secondary | ICD-10-CM

## 2019-06-25 DIAGNOSIS — M25561 Pain in right knee: Secondary | ICD-10-CM

## 2019-06-25 NOTE — Therapy (Signed)
[place], [place], [address] Phone: [phone]   Fax:  [phone]  Physical Therapy Treatment  Patient Details  Name: Daniel Solis MRN: [id number] Date of Birth: Jun 25, 1975 Referring Provider (PT): Emogene Morgan B. Daws   Encounter Date: 06/25/2019  PT End of Session - 06/25/19 1013    Visit Number  8    Authorization Type  Medicaid will limit visits    PT Start Time  0930    PT Stop Time  6168    PT Time Calculation (min)  44 min    Activity Tolerance  Patient tolerated treatment well    Behavior During Therapy  Anxious       Past Medical History:  Diagnosis Date  . Anxiety   . Attention deficit disorder   . Cigarette smoker   . Hepatitis C   . Hypertension   . TIA (transient ischemic attack)     Past Surgical History:  Procedure Laterality Date  . ARM DEBRIDEMENT      There were no vitals filed for this visit.  Subjective Assessment - 06/25/19 0934    Subjective  "Feeling all right"    Currently in Pain?  Yes    Pain Score  2     Pain Location  Knee    Pain Orientation  Left                       [facility] Adult PT Treatment/Exercise - 06/25/19 0001      Knee/Hip Exercises: Aerobic   Recumbent Bike  x 4 minutes, slow    Nustep  L3 LE only x4 min       Knee/Hip Exercises: Machines for Strengthening   Cybex Knee Extension  10# 2x10    Cybex Knee Flexion  25# 2x10, RLE 15lb 2x10     Cybex Leg Press  20# 2x15, RLE TKE 20lb 3x5       Knee/Hip Exercises: Standing   Heel Raises  Both;2 sets;15 reps    Forward Step Up  Right;2 sets;10 reps;Hand Hold: 0;Step Height: 6"    Walking with Sports Cord  all directions      Knee/Hip Exercises: Supine   Short Arc Quad Sets  Right;2 sets;15 reps    Short Arc Quad Sets Limitations  5                  PT Long Term Goals - 06/25/19 1021      PT LONG TERM GOAL #1   Title  independent with HEP    Status   Achieved      PT LONG TERM GOAL #2   Title  decrease pain with activity 50%    Status  Partially Met            Plan - 06/25/19 1014    Clinical Impression Statement  Weakness still present in the R quad. Cues needed to keep RLE in contact with pad with extensions. Cues also needed to complete TKE with step ups. Some R knee instability exposed during the eccentric phase of resisted side steps, weight pulling to his R    Stability/Clinical Decision Making  Evolving/Moderate complexity    Rehab Potential  Good    PT Frequency  1x / week    PT Duration  12 weeks    PT Treatment/Interventions  ADLs/Self Care Home Management;Electrical Stimulation;Iontophoresis '4mg'$ /ml Dexamethasone;Cryotherapy;Moist Heat;Ultrasound;Gait training;Stair training;Functional mobility training;Balance training;Therapeutic exercise;Therapeutic activities;Patient/family  education;Manual techniques;Vasopneumatic Device    PT Next Visit Plan  activities to help woth ROM, strength, function and stability       Patient will benefit from skilled therapeutic intervention in order to improve the following deficits and impairments:  Abnormal gait, Pain, Decreased mobility, Decreased activity tolerance, Decreased range of motion, Decreased strength, Decreased balance, Difficulty walking, Increased edema, Impaired flexibility  Visit Diagnosis: Stiffness of right knee, not elsewhere classified  Localized edema  Acute pain of right knee  Difficulty in walking, not elsewhere classified     Problem List Patient Active Problem List   Diagnosis Date Noted  . Paresthesia 02/04/2016  . Smoker 02/13/2011  . Left hemiparesis ([place]) 02/13/2011  . Hemiplegia and hemiparesis 02/11/2011    Scot Jun 06/25/2019, 10:22 AM  [place], [place], [address] Phone: [phone]   Fax:  [phone]  Name: Daniel Solis MRN:  [id number] Date of Birth: November 20, 1975

## 2019-07-02 ENCOUNTER — Other Ambulatory Visit: Payer: Self-pay

## 2019-07-02 ENCOUNTER — Ambulatory Visit: Payer: Medicaid Other | Attending: Orthopaedic Surgery | Admitting: Physical Therapy

## 2019-07-02 ENCOUNTER — Encounter: Payer: Self-pay | Admitting: Physical Therapy

## 2019-07-02 DIAGNOSIS — M25661 Stiffness of right knee, not elsewhere classified: Secondary | ICD-10-CM

## 2019-07-02 DIAGNOSIS — M25561 Pain in right knee: Secondary | ICD-10-CM | POA: Diagnosis present

## 2019-07-02 DIAGNOSIS — R262 Difficulty in walking, not elsewhere classified: Secondary | ICD-10-CM | POA: Insufficient documentation

## 2019-07-02 DIAGNOSIS — R600 Localized edema: Secondary | ICD-10-CM | POA: Diagnosis not present

## 2019-07-02 NOTE — Therapy (Addendum)
[place], [place], [address] Phone: [phone]   Fax:  [phone]  Physical Therapy Treatment  Patient Details  Name: Daniel Solis MRN: [id number] Date of Birth: 1975/07/17 Referring Provider (PT): Emogene Morgan B. Daws   Encounter Date: 07/02/2019  PT End of Session - 07/02/19 1037    Visit Number  9    Number of Visits  12    Date for PT Re-Evaluation  07/22/19    Authorization Type  Medicaid will limit visits    PT Start Time  1011    PT Stop Time  1039    PT Time Calculation (min)  28 min    Activity Tolerance  Patient tolerated treatment well    Behavior During Therapy  Anxious       Past Medical History:  Diagnosis Date  . Anxiety   . Attention deficit disorder   . Cigarette smoker   . Hepatitis C   . Hypertension   . TIA (transient ischemic attack)     Past Surgical History:  Procedure Laterality Date  . ARM DEBRIDEMENT      There were no vitals filed for this visit.  Subjective Assessment - 07/02/19 1015    Subjective  "Im all right  now, but when you get me on the steps it hurts"    Currently in Pain?  No/denies    Pain Score  0-No pain                       [facility] Adult PT Treatment/Exercise - 07/02/19 0001      Knee/Hip Exercises: Aerobic   Recumbent Bike  x 3 minutes, slow    Nustep  L3 LE only x5 min       Knee/Hip Exercises: Machines for Strengthening   Cybex Knee Extension  10# 2x10    Cybex Knee Flexion  25# 2x10, RLE 25lb 2x10     Cybex Leg Press  20# 2x15                  PT Long Term Goals - 06/25/19 1021      PT LONG TERM GOAL #1   Title  independent with HEP    Status  Achieved      PT LONG TERM GOAL #2   Title  decrease pain with activity 50%    Status  Partially Met            Plan - 07/02/19 1038    Clinical Impression Statement  Shorten treatment time per pt request that he had a job to do. He did well with  the interventions. Increase resistance tolerated with seated single leg curls. Cues needed at times for full TKE with extensions.    Stability/Clinical Decision Making  Evolving/Moderate complexity    Rehab Potential  Good    PT Frequency  1x / week    PT Duration  12 weeks    PT Treatment/Interventions  ADLs/Self Care Home Management;Electrical Stimulation;Iontophoresis '4mg'$ /ml Dexamethasone;Cryotherapy;Moist Heat;Ultrasound;Gait training;Stair training;Functional mobility training;Balance training;Therapeutic exercise;Therapeutic activities;Patient/family education;Manual techniques;Vasopneumatic Device    PT Next Visit Plan  activities to help woth ROM, strength, function and stability       Patient will benefit from skilled therapeutic intervention in order to improve the following deficits and impairments:  Abnormal gait, Pain, Decreased mobility, Decreased activity tolerance, Decreased range of motion, Decreased strength, Decreased balance, Difficulty walking, Increased edema, Impaired flexibility  Visit  Diagnosis: Localized edema  Stiffness of right knee, not elsewhere classified  Acute pain of right knee  Difficulty in walking, not elsewhere classified  D/C due to tib/fib fracture    Problem List Patient Active Problem List   Diagnosis Date Noted  . Paresthesia 02/04/2016  . Smoker 02/13/2011  . Left hemiparesis ([place]) 02/13/2011  . Hemiplegia and hemiparesis 02/11/2011    Scot Jun, PTA 07/02/2019, 10:40 AM  [place], [place], [address] Phone: [phone]   Fax:  [phone]  Name: Daniel Solis MRN: [id number] Date of Birth: 1975-11-07

## 2019-07-04 ENCOUNTER — Inpatient Hospital Stay (HOSPITAL_COMMUNITY)
Admission: EM | Admit: 2019-07-04 | Discharge: 2019-07-10 | DRG: 493 | Disposition: A | Discharge status: Home Health | Payer: Medicaid Other | Attending: Orthopaedic Surgery | Admitting: Orthopaedic Surgery

## 2019-07-04 ENCOUNTER — Emergency Department (HOSPITAL_COMMUNITY): Payer: Medicaid Other

## 2019-07-04 ENCOUNTER — Other Ambulatory Visit: Payer: Self-pay

## 2019-07-04 ENCOUNTER — Encounter (HOSPITAL_COMMUNITY): Payer: Self-pay

## 2019-07-04 DIAGNOSIS — F909 Attention-deficit hyperactivity disorder, unspecified type: Secondary | ICD-10-CM | POA: Diagnosis present

## 2019-07-04 DIAGNOSIS — F419 Anxiety disorder, unspecified: Secondary | ICD-10-CM | POA: Diagnosis present

## 2019-07-04 DIAGNOSIS — W010XXA Fall on same level from slipping, tripping and stumbling without subsequent striking against object, initial encounter: Secondary | ICD-10-CM | POA: Diagnosis present

## 2019-07-04 DIAGNOSIS — K760 Fatty (change of) liver, not elsewhere classified: Secondary | ICD-10-CM | POA: Diagnosis present

## 2019-07-04 DIAGNOSIS — S82401A Unspecified fracture of shaft of right fibula, initial encounter for closed fracture: Secondary | ICD-10-CM | POA: Diagnosis present

## 2019-07-04 DIAGNOSIS — Z20822 Contact with and (suspected) exposure to covid-19: Secondary | ICD-10-CM | POA: Diagnosis present

## 2019-07-04 DIAGNOSIS — F10230 Alcohol dependence with withdrawal, uncomplicated: Secondary | ICD-10-CM | POA: Diagnosis not present

## 2019-07-04 DIAGNOSIS — Z79899 Other long term (current) drug therapy: Secondary | ICD-10-CM | POA: Diagnosis not present

## 2019-07-04 DIAGNOSIS — I10 Essential (primary) hypertension: Secondary | ICD-10-CM | POA: Diagnosis present

## 2019-07-04 DIAGNOSIS — S82201A Unspecified fracture of shaft of right tibia, initial encounter for closed fracture: Secondary | ICD-10-CM | POA: Diagnosis present

## 2019-07-04 DIAGNOSIS — F17210 Nicotine dependence, cigarettes, uncomplicated: Secondary | ICD-10-CM | POA: Diagnosis present

## 2019-07-04 DIAGNOSIS — F10239 Alcohol dependence with withdrawal, unspecified: Secondary | ICD-10-CM | POA: Diagnosis present

## 2019-07-04 DIAGNOSIS — Y9371 Activity, boxing: Secondary | ICD-10-CM | POA: Diagnosis not present

## 2019-07-04 DIAGNOSIS — Y92007 Garden or yard of unspecified non-institutional (private) residence as the place of occurrence of the external cause: Secondary | ICD-10-CM

## 2019-07-04 DIAGNOSIS — Z8673 Personal history of transient ischemic attack (TIA), and cerebral infarction without residual deficits: Secondary | ICD-10-CM | POA: Diagnosis not present

## 2019-07-04 DIAGNOSIS — S82209A Unspecified fracture of shaft of unspecified tibia, initial encounter for closed fracture: Secondary | ICD-10-CM

## 2019-07-04 DIAGNOSIS — F17200 Nicotine dependence, unspecified, uncomplicated: Secondary | ICD-10-CM | POA: Diagnosis not present

## 2019-07-04 LAB — CBC WITH DIFFERENTIAL/PLATELET
Abs Immature Granulocytes: 0.04 10*3/uL (ref 0.00–0.07)
Basophils Absolute: 0.1 10*3/uL (ref 0.0–0.1)
Basophils Relative: 1 %
Eosinophils Absolute: 0.1 10*3/uL (ref 0.0–0.5)
Eosinophils Relative: 1 %
HCT: 48.6 % (ref 39.0–52.0)
Hemoglobin: 16 g/dL (ref 13.0–17.0)
Immature Granulocytes: 1 %
Lymphocytes Relative: 33 %
Lymphs Abs: 2.7 10*3/uL (ref 0.7–4.0)
MCH: 30.1 pg (ref 26.0–34.0)
MCHC: 32.9 g/dL (ref 30.0–36.0)
MCV: 91.5 fL (ref 80.0–100.0)
Monocytes Absolute: 0.8 10*3/uL (ref 0.1–1.0)
Monocytes Relative: 10 %
Neutro Abs: 4.5 10*3/uL (ref 1.7–7.7)
Neutrophils Relative %: 54 %
Platelets: 165 10*3/uL (ref 150–400)
RBC: 5.31 MIL/uL (ref 4.22–5.81)
RDW: 13.3 % (ref 11.5–15.5)
WBC: 8.1 10*3/uL (ref 4.0–10.5)
nRBC: 0 % (ref 0.0–0.2)

## 2019-07-04 LAB — RESPIRATORY PANEL BY RT PCR (FLU A&B, COVID)
Influenza A by PCR: NEGATIVE
Influenza B by PCR: NEGATIVE
SARS Coronavirus 2 by RT PCR: NEGATIVE

## 2019-07-04 LAB — BASIC METABOLIC PANEL
Anion gap: 10 (ref 5–15)
BUN: 20 mg/dL (ref 6–20)
CO2: 26 mmol/L (ref 22–32)
Calcium: 8.5 mg/dL — ABNORMAL LOW (ref 8.9–10.3)
Chloride: 106 mmol/L (ref 98–111)
Creatinine, Ser: 0.95 mg/dL (ref 0.61–1.24)
GFR calc Af Amer: >60 mL/min (ref >60)
GFR calc non Af Amer: >60 mL/min (ref >60)
Glucose, Bld: 88 mg/dL (ref 70–99)
Potassium: 3.7 mmol/L (ref 3.5–5.1)
Sodium: 142 mmol/L (ref 135–145)

## 2019-07-04 MED ORDER — METOPROLOL TARTRATE 50 MG PO TABS
50.0000 mg | ORAL_TABLET | Freq: Two times a day (BID) | ORAL | Status: DC
  Administered 2019-07-04 – 2019-07-10 (×11): 50 mg via ORAL
  Filled 2019-07-04 (×11): qty 1

## 2019-07-04 MED ORDER — METHOCARBAMOL 500 MG PO TABS
500.0000 mg | ORAL_TABLET | Freq: Four times a day (QID) | ORAL | Status: DC | PRN
  Administered 2019-07-07 – 2019-07-09 (×4): 500 mg via ORAL
  Filled 2019-07-04 (×2): qty 1

## 2019-07-04 MED ORDER — ALPRAZOLAM 1 MG PO TABS
1.0000 mg | ORAL_TABLET | Freq: Two times a day (BID) | ORAL | Status: DC
  Administered 2019-07-04 – 2019-07-07 (×6): 1 mg via ORAL
  Filled 2019-07-04 (×6): qty 1

## 2019-07-04 MED ORDER — ADULT MULTIVITAMIN W/MINERALS CH
1.0000 | ORAL_TABLET | Freq: Every day | ORAL | Status: DC
  Administered 2019-07-06 – 2019-07-10 (×5): 1 via ORAL
  Filled 2019-07-04 (×5): qty 1

## 2019-07-04 MED ORDER — DIPHENHYDRAMINE HCL 12.5 MG/5ML PO ELIX: 12.5000 mg | ORAL_SOLUTION | ORAL | Status: DC | PRN

## 2019-07-04 MED ORDER — OXYCODONE HCL 5 MG PO TABS: 5.0000 mg | ORAL_TABLET | ORAL | Status: DC | PRN

## 2019-07-04 MED ORDER — ALBUTEROL SULFATE HFA 108 (90 BASE) MCG/ACT IN AERS: 2.0000 | INHALATION_SPRAY | RESPIRATORY_TRACT | Status: DC | PRN

## 2019-07-04 MED ORDER — ALBUTEROL SULFATE (2.5 MG/3ML) 0.083% IN NEBU: 2.5000 mg | INHALATION_SOLUTION | Freq: Four times a day (QID) | RESPIRATORY_TRACT | Status: DC | PRN

## 2019-07-04 MED ORDER — HYDROMORPHONE HCL 1 MG/ML IJ SOLN
1.0000 mg | Freq: Once | INTRAMUSCULAR | Status: AC
  Administered 2019-07-04: 21:00:00 1 mg via INTRAVENOUS
  Filled 2019-07-04: qty 1

## 2019-07-04 MED ORDER — SODIUM CHLORIDE 0.9 % IV BOLUS
500.0000 mL | Freq: Once | INTRAVENOUS | Status: AC
  Administered 2019-07-04: 500 mL via INTRAVENOUS

## 2019-07-04 MED ORDER — SODIUM CHLORIDE 0.9 % IV SOLN: INTRAVENOUS | Status: DC

## 2019-07-04 MED ORDER — METHOCARBAMOL 1000 MG/10ML IJ SOLN
500.0000 mg | Freq: Four times a day (QID) | INTRAVENOUS | Status: DC | PRN
  Filled 2019-07-04: qty 5

## 2019-07-04 MED ORDER — HYDROMORPHONE HCL 1 MG/ML IJ SOLN
0.5000 mg | INTRAMUSCULAR | Status: DC | PRN
  Administered 2019-07-04 – 2019-07-08 (×8): 1 mg via INTRAVENOUS
  Filled 2019-07-04 (×8): qty 1

## 2019-07-04 MED ORDER — KETAMINE HCL 50 MG/5ML IJ SOSY
1.0000 mg/kg | PREFILLED_SYRINGE | Freq: Once | INTRAMUSCULAR | Status: AC
  Administered 2019-07-04: 22:00:00 36.5 mg via INTRAVENOUS
  Filled 2019-07-04: qty 15

## 2019-07-04 MED ORDER — PROPOFOL 10 MG/ML IV BOLUS
0.5000 mg/kg | Freq: Once | INTRAVENOUS | Status: AC
  Administered 2019-07-04: 22:00:00 36.5 mg via INTRAVENOUS
  Filled 2019-07-04: qty 20

## 2019-07-04 MED ORDER — ONDANSETRON HCL 4 MG/2ML IJ SOLN
4.0000 mg | Freq: Once | INTRAMUSCULAR | Status: AC
  Administered 2019-07-04: 21:00:00 4 mg via INTRAVENOUS
  Filled 2019-07-04: qty 2

## 2019-07-04 MED ORDER — HYDROMORPHONE HCL 1 MG/ML IJ SOLN
1.0000 mg | Freq: Once | INTRAMUSCULAR | Status: AC
  Administered 2019-07-04: 22:00:00 1 mg via INTRAVENOUS
  Filled 2019-07-04: qty 1

## 2019-07-04 MED ORDER — ACETAMINOPHEN 325 MG PO TABS
325.0000 mg | ORAL_TABLET | Freq: Four times a day (QID) | ORAL | Status: DC | PRN
  Administered 2019-07-06 – 2019-07-07 (×2): 650 mg via ORAL

## 2019-07-04 MED ORDER — AMPHETAMINE-DEXTROAMPHET ER 10 MG PO CP24
30.0000 mg | ORAL_CAPSULE | Freq: Two times a day (BID) | ORAL | Status: DC
  Administered 2019-07-06: 08:00:00 30 mg via ORAL
  Filled 2019-07-04 (×4): qty 3

## 2019-07-04 MED ORDER — OXYCODONE HCL 5 MG PO TABS
10.0000 mg | ORAL_TABLET | ORAL | Status: DC | PRN
  Administered 2019-07-05 – 2019-07-09 (×10): 15 mg via ORAL
  Filled 2019-07-04 (×16): qty 3

## 2019-07-04 NOTE — ED Triage Notes (Signed)
Patient arrived via [facility] after landing a kick wrong, per EMS obvious tib-fib fracture. Patient given 100 mcg Fentanyl en route. Patient unable to move toes and only some sensation felt.

## 2019-07-04 NOTE — ED Notes (Addendum)
ED TO INPATIENT HANDOFF REPORT  ED Nurse Name and Phone #: [facility] [phone]  S Name/Age/Gender Daniel Solis 44 y.o. male Room/Bed: WA18/WA18  Code Status   Code Status: Not on file  Home/SNF/Other Home Patient oriented to: self, place, time and situation Is this baseline? Yes   Triage Complete: Triage complete  Chief Complaint Closed fracture of right tibia and fibula [S82.201A, S82.401A]  Triage Note Patient arrived via [facility] after landing a kick wrong, per EMS obvious tib-fib fracture. Patient given 100 mcg Fentanyl en route. Patient unable to move toes and only some sensation felt.    Allergies No Known Allergies  Level of Care/Admitting Diagnosis ED Disposition    ED Disposition Condition Comment   Admit  Hospital Area: [facility] [[id number]]  Level of Care: Med-Surg [[id number]]  May admit patient to [facility] or [facility] if equivalent level of care is available:: No  Covid Evaluation: Asymptomatic Screening Protocol (No Symptoms)  Diagnosis: Closed fracture of right tibia and fibula [413244]  Admitting Physician: [place], [place]  Attending Physician: Mcarthur Rossetti [[id number]]  Estimated length of stay: 3 - 4 days  Certification:: I certify this patient will need inpatient services for at least 2 midnights       B Medical/Surgery History Past Medical History:  Diagnosis Date  . Anxiety   . Attention deficit disorder   . Cigarette smoker   . Hepatitis C   . Hypertension   . TIA (transient ischemic attack)    Past Surgical History:  Procedure Laterality Date  . ARM DEBRIDEMENT       A IV Location/Drains/Wounds Patient Lines/Drains/Airways Status   Active Line/Drains/Airways    Name:   Placement date:   Placement time:   Site:   Days:   Peripheral IV 07/04/19 Anterior;Right Forearm   07/04/19    1957    Forearm   less than 1          Intake/Output Last 24 hours  Intake/Output Summary (Last 24 hours) at  07/04/2019 2246 Last data filed at 07/04/2019 2224 Gross per 24 hour  Intake 500 ml  Output --  Net 500 ml    Labs/Imaging Results for orders placed or performed during the hospital encounter of 07/04/19 (from the past 48 hour(s))  Basic metabolic panel     Status: Abnormal   Collection Time: 07/04/19  9:02 PM  Result Value Ref Range   Sodium 142 135 - 145 mmol/L   Potassium 3.7 3.5 - 5.1 mmol/L   Chloride 106 98 - 111 mmol/L   CO2 26 22 - 32 mmol/L   Glucose, Bld 88 70 - 99 mg/dL    Comment: Glucose reference range applies only to samples taken after fasting for at least 8 hours.   BUN 20 6 - 20 mg/dL   Creatinine, Ser 0.95 0.61 - 1.24 mg/dL   Calcium 8.5 (L) 8.9 - 10.3 mg/dL   GFR calc non Af Amer >60 >60 mL/min   GFR calc Af Amer >60 >60 mL/min   Anion gap 10 5 - 15    Comment: Performed at [facility], [place] [address] [place], [place] [address]  CBC with Differential     Status: None   Collection Time: 07/04/19  9:02 PM  Result Value Ref Range   WBC 8.1 4.0 - 10.5 K/uL   RBC 5.31 4.22 - 5.81 MIL/uL   Hemoglobin 16.0 13.0 - 17.0 g/dL   HCT 48.6 39.0 -  52.0 %   MCV 91.5 80.0 - 100.0 fL   MCH 30.1 26.0 - 34.0 pg   MCHC 32.9 30.0 - 36.0 g/dL   RDW 32.2 02.5 - 42.7 %   Platelets 165 150 - 400 K/uL   nRBC 0.0 0.0 - 0.2 %   Neutrophils Relative % 54 %   Neutro Abs 4.5 1.7 - 7.7 K/uL   Lymphocytes Relative 33 %   Lymphs Abs 2.7 0.7 - 4.0 K/uL   Monocytes Relative 10 %   Monocytes Absolute 0.8 0.1 - 1.0 K/uL   Eosinophils Relative 1 %   Eosinophils Absolute 0.1 0.0 - 0.5 K/uL   Basophils Relative 1 %   Basophils Absolute 0.1 0.0 - 0.1 K/uL   Immature Granulocytes 1 %   Abs Immature Granulocytes 0.04 0.00 - 0.07 K/uL    Comment: Performed at [facility], [address] [place], [place] [address]  Respiratory Panel by RT PCR (Flu A&B, Covid) - Nasopharyngeal Swab     Status: None   Collection Time: 07/04/19  9:10 PM   Specimen:  Nasopharyngeal Swab  Result Value Ref Range   SARS Coronavirus 2 by RT PCR NEGATIVE NEGATIVE    Comment: (NOTE) SARS-CoV-2 target nucleic acids are NOT DETECTED. The SARS-CoV-2 RNA is generally detectable in upper respiratoy specimens during the acute phase of infection. The lowest concentration of SARS-CoV-2 viral copies this assay can detect is 131 copies/mL. A negative result does not preclude SARS-Cov-2 infection and should not be used as the sole basis for treatment or other patient management decisions. A negative result may occur with  improper specimen collection/handling, submission of specimen other than nasopharyngeal swab, presence of viral mutation(s) within the areas targeted by this assay, and inadequate number of viral copies (<131 copies/mL). A negative result must be combined with clinical observations, patient history, and epidemiological information. The expected result is Negative. Fact Sheet for Patients:  https://www.moore.com/ Fact Sheet for Healthcare Providers:  https://www.young.biz/ This test is not yet ap proved or cleared by the [place] FDA and  has been authorized for detection and/or diagnosis of SARS-CoV-2 by FDA under an Emergency Use Authorization (EUA). This EUA will remain  in effect (meaning this test can be used) for the duration of the COVID-19 declaration under Section 564(b)(1) of the Act, 21 U.S.C. section 360bbb-3(b)(1), unless the authorization is terminated or revoked sooner.    Influenza A by PCR NEGATIVE NEGATIVE   Influenza B by PCR NEGATIVE NEGATIVE    Comment: (NOTE) The Xpert Xpress SARS-CoV-2/FLU/RSV assay is intended as an aid in  the diagnosis of influenza from Nasopharyngeal swab specimens and  should not be used as a sole basis for treatment. Nasal washings and  aspirates are unacceptable for Xpert Xpress SARS-CoV-2/FLU/RSV  testing. Fact Sheet for  Patients: https://www.moore.com/ Fact Sheet for Healthcare Providers: https://www.young.biz/ This test is not yet approved or cleared by the [place] FDA and  has been authorized for detection and/or diagnosis of SARS-CoV-2 by  FDA under an Emergency Use Authorization (EUA). This EUA will remain  in effect (meaning this test can be used) for the duration of the  Covid-19 declaration under Section 564(b)(1) of the Act, 21  U.S.C. section 360bbb-3(b)(1), unless the authorization is  terminated or revoked. Performed at [facility], [address] [place], [place] [address]    DG Tibia/Fibula Right  Result Date: 07/04/2019 CLINICAL DATA:  Status post trauma. EXAM: RIGHT TIBIA AND FIBULA - 2 VIEW  COMPARISON:  None. FINDINGS: Acute fracture deformities are seen involving the shafts of the mid to distal right tibia and right fibula, at the junction of the middle and distal 1/3. There is moderate to marked severity anterior angulation of the fracture apices. Additional nondisplaced fractures are seen extending through the proximal aspects of the right tibial and right fibula. No dislocation is identified. Soft tissue swelling is seen surrounding the previously described distal fracture sites. IMPRESSION: Acute fractures of proximal and mid to distal portions of the right tibia and right fibula. Electronically Signed   By: Aram Candela M.D.   On: 07/04/2019 20:31    Pending Labs Unresulted Labs (From admission, onward)    Start     Ordered   Signed and Held  HIV Antibody (routine testing w rflx)  (HIV Antibody (Routine testing w reflex) panel)  Once,   R     Signed and Held          Vitals/Pain Today's Vitals   07/04/19 2145 07/04/19 2146 07/04/19 2201 07/04/19 2231  BP: (!) 195/130 (!) 195/130 (!) 171/127   Pulse: (!) 109 94 93   Resp: 15 18    Temp:      TempSrc:      SpO2: (!) 88% 98% 98%   Weight:      Height:       PainSc:    10-Worst pain ever    Isolation Precautions No active isolations  Medications Medications  HYDROmorphone (DILAUDID) injection 1 mg (1 mg Intravenous Given 07/04/19 2101)  ondansetron (ZOFRAN) injection 4 mg (4 mg Intravenous Given 07/04/19 2101)  ketamine 50 mg in normal saline 5 mL (10 mg/mL) syringe (36.5 mg Intravenous Given 07/04/19 2140)  propofol (DIPRIVAN) 10 mg/mL bolus/IV push 56.7 mg (36.5 mg Intravenous Given 07/04/19 2140)  sodium chloride 0.9 % bolus 500 mL (0 mLs Intravenous Stopped 07/04/19 2224)  HYDROmorphone (DILAUDID) injection 1 mg (1 mg Intravenous Given 07/04/19 2224)    Mobility non-ambulatory Moderate fall risk   Focused Assessments Gen Med   R Recommendations: See Admitting Provider Note  Report given to: linda RN Additional Notes:

## 2019-07-04 NOTE — H&P (Signed)
See Consult Note

## 2019-07-04 NOTE — Consult Note (Signed)
Reason for Consult:  Right tibia/fibula fracture Referring Physician:  Dr. Eulis Foster, EDP  Daniel Solis is an 44 y.o. male.  HPI: The patient is a 44 year old gentleman who was doing some type of kickboxing and horsing around the yard when he sustained an accidental hard mechanical fall injuring his right leg.  He had an obvious deformity of the tibia and fibula and was placed in a foam splint by EMS and brought to [facility] department.  In the emergency room x-rays were obtained consistent with a distal third right tibia and fibula fracture.  The patient reports significant right leg pain.  He has not injured this lower leg before but he does have a history of an ACL injury he states on that right side.  He is a smoker but reports recently backing down.  He denies any numbness and tingling in his right foot.  He does report some low back and hip pain bilaterally from how hard he fell.  His legs are all scraped up from what he reports doing weed eating.  He denies any other acute medical issues.  A COVID-19 test is pending.  Past Medical History:  Diagnosis Date  . Anxiety   . Attention deficit disorder   . Cigarette smoker   . Hepatitis C   . Hypertension   . TIA (transient ischemic attack)     Past Surgical History:  Procedure Laterality Date  . ARM DEBRIDEMENT      Family History  Problem Relation Age of Onset  . Cancer Other     Social History:  reports that he has been smoking cigarettes. He has been smoking about 1.00 pack per day. He has never used smokeless tobacco. He reports that he does not drink alcohol or use drugs.  Allergies: No Known Allergies  Medications: I have reviewed the patient's current medications.  No results found for this or any previous visit (from the past 48 hour(s)).  DG Tibia/Fibula Right  Result Date: 07/04/2019 CLINICAL DATA:  Status post trauma. EXAM: RIGHT TIBIA AND FIBULA - 2 VIEW COMPARISON:  None. FINDINGS: Acute fracture  deformities are seen involving the shafts of the mid to distal right tibia and right fibula, at the junction of the middle and distal 1/3. There is moderate to marked severity anterior angulation of the fracture apices. Additional nondisplaced fractures are seen extending through the proximal aspects of the right tibial and right fibula. No dislocation is identified. Soft tissue swelling is seen surrounding the previously described distal fracture sites. IMPRESSION: Acute fractures of proximal and mid to distal portions of the right tibia and right fibula. Electronically Signed   By: Virgina Norfolk M.D.   On: 07/04/2019 20:31    Review of Systems  All other systems reviewed and are negative.  Blood pressure (!) 156/96, pulse 88, temperature 98.2 F (36.8 C), temperature source Oral, resp. rate 15, height 5\' 10"  (1.778 m), weight 113.4 kg, SpO2 98 %. Physical Exam  Constitutional: He is oriented to person, place, and time. He appears well-developed and well-nourished.  HENT:  Head: Normocephalic and atraumatic.  Eyes: Pupils are equal, round, and reactive to light. EOM are normal.  Cardiovascular: Normal rate and regular rhythm.  Respiratory: Effort normal and breath sounds normal.  GI: Soft. Bowel sounds are normal.  Musculoskeletal:     Cervical back: Normal range of motion and neck supple.     Right lower leg: Swelling, deformity, tenderness and bony tenderness present. Edema present.  Neurological:  He is alert and oriented to person, place, and time.  Skin: Skin is warm and dry.  Psychiatric: He has a normal mood and affect.    His right foot has a bounding and easily palpable dorsalis pedis and posterior tibial pulse.  There is obvious angulation of the leg but the skin is intact other than multiple abrasions around both legs that are superficial.  His compartments are soft and he has normal sensation over the the foot.  Assessment/Plan: Right tibia/fibula fracture that is closed  but with significant displacement and angulation  While in the emergency room I talked to the patient in length about treatment options for this fracture.  The standard of care at this point is usually a tibial nail.  He does not show any evidence of compartment syndrome thus far.  We talked about sedating him in getting his fracture of the length and then splinting it.  I would then allow him to eat and admit him for surgery first thing in the morning.  I have already posted him for surgery for first thing in the morning tomorrow.  I discussed in detail with the surgery involves and he does wish to proceed with surgery in the morning.  Kathryne Hitch 07/04/2019, 9:23 PM

## 2019-07-04 NOTE — ED Provider Notes (Signed)
[facility]-EMERGENCY DEPT Provider Note   CSN: [id number] Arrival date & time: 07/04/19  1943     History Chief Complaint  Patient presents with  . Leg Pain    Daniel Solis is a 44 y.o. male.  HPI The patient was outside, on grass, did a round healthcare, and slipped and fell injuring his right leg.  He also complains of pain in the left hip.  He states he was kicking a heavy bag, when this happened.  He denies concurrent illnesses.  He had his first Covid vaccine, 2 weeks ago.  There are no other known modifying factors.    Past Medical History:  Diagnosis Date  . Anxiety   . Attention deficit disorder   . Cigarette smoker   . Hepatitis C   . Hypertension   . TIA (transient ischemic attack)     Patient Active Problem List   Diagnosis Date Noted  . Closed fracture of right fibula and tibia   . Paresthesia 02/04/2016  . Smoker 02/13/2011  . Left hemiparesis (HCC) 02/13/2011  . 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 ARM DEBRIDEMENT         Family History  Problem Relation Age of Onset  . Cancer Other     Social History   Tobacco Use  . Smoking status: Current Every Day Smoker    Packs/day: 1.00    Types: Cigarettes  . Smokeless tobacco: Never Used  Substance Use Topics  . Alcohol use: No  . Drug use: No    Home Medications Prior to Admission medications   Medication Sig Start Date End Date Taking? Authorizing Provider  ADDERALL XR 30 MG 24 hr capsule Take 30 mg by mouth 2 (two) times daily. 07/05/17   [provider]  ALPRAZolam Prudy Feeler) 1 MG tablet Take 1 mg by mouth 2 (two) times daily. 06/27/17   [provider]  cyclobenzaprine (FLEXERIL) 10 MG tablet Take 1 tablet (10 mg total) by mouth 3 (three) times daily as needed for muscle spasms. 07/13/17   Shaune Pollack, MD  Diclofenac Sodium CR 100 MG 24 hr tablet Take 1 tablet (100 mg total) by mouth daily.  09/22/18   Palumbo, April, MD  lidocaine (LIDODERM) 5 % Place 1 patch onto the skin daily. Remove & Discard patch within 12 hours or as directed by MD 09/22/18   Nicanor Alcon, April, MD  methocarbamol (ROBAXIN) 500 MG tablet Take 1 tablet (500 mg total) by mouth 2 (two) times daily. 09/22/18   Palumbo, April, MD  metoprolol tartrate (LOPRESSOR) 50 MG tablet Take 50 mg by mouth 2 (two) times daily. 05/21/17   [provider]  Multiple Vitamin (MULTIVITAMIN WITH MINERALS) TABS tablet Take 1 tablet by mouth daily.    [provider]  PROAIR HFA 108 [phone] Base) MCG/ACT inhaler Inhale 2 puffs into the lungs every 4 (four) hours as needed. 06/27/17   [provider]    Allergies    Patient has no known allergies.  Review of Systems   Review of Systems  All other systems reviewed and are negative.   Physical Exam Updated Vital Signs BP (!) 195/130   Pulse 94   Temp 98.2 F (36.8 C) (Oral)   Resp 18   Ht 5\' 10"  (1.778 m)   Wt 113.4 kg   SpO2 98%   BMI 35.87 kg/m   Physical Exam Vitals and nursing note reviewed.  Constitutional:  General: He is in acute distress.     Appearance: He is well-developed. He is not ill-appearing, toxic-appearing or diaphoretic.  HENT:     Head: Normocephalic and atraumatic.     Right Ear: External ear normal.     Left Ear: External ear normal.  Eyes:     Conjunctiva/sclera: Conjunctivae normal.     Pupils: Pupils are equal, round, and reactive to light.  Neck:     Trachea: Phonation normal.  Cardiovascular:     Rate and Rhythm: Normal rate and regular rhythm.     Heart sounds: Normal heart sounds.  Pulmonary:     Effort: Pulmonary effort is normal.     Breath sounds: Normal breath sounds.  Abdominal:     General: There is no distension.     Palpations: Abdomen is soft.     Tenderness: There is no abdominal tenderness.  Musculoskeletal:     Cervical back: Normal range of motion and neck supple.     Comments: Left lower leg  deformity consistent with distal third tib-fib fracture.  No skin break, concerning for open fracture.  Intact dorsalis pedis pulse, and intact sensation toes.  Skin:    General: Skin is warm and dry.  Neurological:     Mental Status: He is alert and oriented to person, place, and time.     Cranial Nerves: No cranial nerve deficit.     Sensory: No sensory deficit.     Motor: No abnormal muscle tone.     Coordination: Coordination normal.  Psychiatric:        Mood and Affect: Mood normal.        Behavior: Behavior normal.        Thought Content: Thought content normal.        Judgment: Judgment normal.     ED Results / Procedures / Treatments   Labs (all labs ordered are listed, but only abnormal results are displayed) Labs Reviewed  BASIC METABOLIC PANEL - Abnormal; Notable for the following components:      Result Value   Calcium 8.5 (*)    All other components within normal limits  RESPIRATORY PANEL BY RT PCR (FLU A&B, COVID)  CBC WITH DIFFERENTIAL/PLATELET    EKG None  Radiology DG Tibia/Fibula Right  Result Date: 07/04/2019 CLINICAL DATA:  Status post trauma. EXAM: RIGHT TIBIA AND FIBULA - 2 VIEW COMPARISON:  None. FINDINGS: Acute fracture deformities are seen involving the shafts of the mid to distal right tibia and right fibula, at the junction of the middle and distal 1/3. There is moderate to marked severity anterior angulation of the fracture apices. Additional nondisplaced fractures are seen extending through the proximal aspects of the right tibial and right fibula. No dislocation is identified. Soft tissue swelling is seen surrounding the previously described distal fracture sites. IMPRESSION: Acute fractures of proximal and mid to distal portions of the right tibia and right fibula. Electronically Signed   By: Virgina Norfolk M.D.   On: 07/04/2019 20:31    Procedures .Sedation  Date/Time: 07/04/2019 9:53 PM Performed by: Daleen Bo, MD Authorized by: Daleen Bo, MD   Consent:    Consent obtained:  Written   Consent given by:  Patient   Risks discussed:  Inadequate sedation   Alternatives discussed:  Analgesia without sedation Universal protocol:    Procedure explained and questions answered to patient or proxy's satisfaction: yes     Imaging studies available: yes     Site/side marked: no  Immediately prior to procedure a time out was called: yes   Indications:    Procedure performed:  Fracture reduction   Procedure necessitating sedation performed by:  Different physician Pre-sedation assessment:    Time since last food or drink:  4 hours   ASA classification: class 1 - normal, healthy patient     Neck mobility: normal     Mouth opening:  3 or more finger widths   Mallampati score:  I - soft palate, uvula, fauces, pillars visible   Pre-sedation assessments completed and reviewed: airway patency, cardiovascular function, hydration status, mental status and pain level     Pre-sedation assessments completed and reviewed: pre-procedure nausea and vomiting status not reviewed     Pre-sedation assessment completed:  07/04/2019 9:40 PM Immediate pre-procedure details:    Reassessment: Patient reassessed immediately prior to procedure     Reviewed: vital signs and NPO status     Verified: bag valve mask available, emergency equipment available, intubation equipment available, IV patency confirmed and suction available   Procedure details (see MAR for exact dosages):    Preoxygenation:  Nasal cannula   Sedation:  Ketamine and propofol   Intended level of sedation: deep   Analgesia:  Hydromorphone   Intra-procedure monitoring:  Blood pressure monitoring, cardiac monitor, continuous pulse oximetry, continuous capnometry, frequent LOC assessments and frequent vital sign checks   Intra-procedure events: none     Total Provider sedation time (minutes):  15 Post-procedure details:    Post-sedation assessment completed:  07/04/2019 9:55 PM    Attendance: Constant attendance by certified staff until patient recovered     Recovery: Patient returned to pre-procedure baseline     Post-sedation assessments completed and reviewed: airway patency, cardiovascular function, hydration status and respiratory function     Post-sedation assessments completed and reviewed: mental status not reviewed, nausea/vomiting not reviewed and pain level not reviewed     Patient tolerance:  Tolerated well, no immediate complications   (including critical care time)  Medications Ordered in ED Medications  HYDROmorphone (DILAUDID) injection 1 mg (1 mg Intravenous Given 07/04/19 2101)  ondansetron (ZOFRAN) injection 4 mg (4 mg Intravenous Given 07/04/19 2101)  ketamine 50 mg in normal saline 5 mL (10 mg/mL) syringe (36.5 mg Intravenous Given 07/04/19 2140)  propofol (DIPRIVAN) 10 mg/mL bolus/IV push 56.7 mg (36.5 mg Intravenous Given 07/04/19 2140)  sodium chloride 0.9 % bolus 500 mL (500 mLs Intravenous New Bag/Given 07/04/19 2135)    ED Course  I have reviewed the triage vital signs and the nursing notes.  Pertinent labs & imaging results that were available during my care of the patient were reviewed by me and considered in my medical decision making (see chart for details).  Clinical Course as of Jul 04 2155  Fri Jul 04, 2019  2046 Case discussed with orthopedics, Dr. Magnus Ivan who will come to the ED to evaluate the patient.   [EW]  2104 I radiology technician remove the splint, for imaging purposes.  At this time he has received a dose of Dilaudid, and his splint was replaced by myself, with assistance from the nurse.  Patient tolerated this well.  No change in neurovascular status post re-application of transfer splint   [EW]    Clinical Course User Index [EW] Mancel Bale, MD   MDM Rules/Calculators/A&P                       Patient Vitals for the past 24 hrs:  BP Temp  Temp src Pulse Resp SpO2 Height Weight  07/04/19 2146 (!) 195/130 -- -- 94 18  98 % -- --  07/04/19 2145 (!) 195/130 -- -- (!) 109 15 (!) 88 % -- --  07/04/19 2140 (!) 177/104 -- -- 90 14 100 % -- --  07/04/19 2130 (!) 167/108 -- -- 92 15 100 % -- --  07/04/19 2118 -- -- -- -- -- -- 5\' 10"  (1.778 m) 113.4 kg  07/04/19 2057 (!) 156/96 -- -- 88 15 98 % -- --  07/04/19 2045 (!) 156/96 -- -- 89 (!) 25 100 % -- --  07/04/19 1953 (!) 163/106 98.2 F (36.8 C) Oral 88 16 100 % -- --    9:57 PM Reevaluation with update and discussion. After initial assessment and treatment, an updated evaluation reveals he is more comfortable after reduction and splinting.  Updated on findings and plan. 09/03/19   Medical Decision Making:  This patient is presenting for evaluation of right lower leg injury, mechanical fall initiated, which does require a range of treatment options, and is a complaint that involves a moderate risk of morbidity and mortality. The differential diagnoses include fracture, open fracture, distracting injury. I decided  to review old records, and in summary the patient has stable chronic mild medical disease. I did not additional historical information from anyone. Clinical Laboratory Tests Ordered, included screening labs. Radiologic Tests Ordered, included tib-fib x-ray, right lower leg, and pelvis x-ray, to be done after initial pain medication. I independently Visualized: Radiologic images, which show tib-fib fracture angulated and displaced.  Critical Interventions-pain control, splinting, orthopedic consultation.  Assistance with sedation for reduction of fracture dislocation by orthopedist at bedside.  After These Interventions, the Patient was reevaluated and was found in improved state, ready for admission.  Scheduled for operative repair, tomorrow morning  CRITICAL CARE-no Performed by: Mancel Bale   Nursing Notes Reviewed/ Care Coordinated Applicable Imaging Reviewed Interpretation of Laboratory Data incorporated into ED treatment  Plan:  Admission    Final Clinical Impression(s) / ED Diagnoses Final diagnoses:  Tibia/fibula fracture, right, closed, initial encounter    Rx / DC Orders ED Discharge Orders    None       Mancel Bale, MD 07/04/19 2158

## 2019-07-05 ENCOUNTER — Inpatient Hospital Stay (HOSPITAL_COMMUNITY): Payer: Medicaid Other | Admitting: Certified Registered Nurse Anesthetist

## 2019-07-05 ENCOUNTER — Encounter (HOSPITAL_COMMUNITY): Payer: Self-pay | Admitting: Orthopaedic Surgery

## 2019-07-05 ENCOUNTER — Inpatient Hospital Stay (HOSPITAL_COMMUNITY): Payer: Medicaid Other

## 2019-07-05 ENCOUNTER — Encounter (HOSPITAL_COMMUNITY)
Admission: EM | Disposition: A | Discharge status: Home Health | Payer: Self-pay | Source: Home / Self Care | Attending: Orthopaedic Surgery

## 2019-07-05 HISTORY — PX: TIBIA IM NAIL INSERTION: SHX2516

## 2019-07-05 LAB — HIV ANTIBODY (ROUTINE TESTING W REFLEX): HIV Screen 4th Generation wRfx: NONREACTIVE

## 2019-07-05 LAB — SURGICAL PCR SCREEN
MRSA, PCR: NEGATIVE
Staphylococcus aureus: NEGATIVE

## 2019-07-05 SURGERY — INSERTION, INTRAMEDULLARY ROD, TIBIA
Anesthesia: General | Site: Leg Lower | Laterality: Right

## 2019-07-05 MED ORDER — LIDOCAINE 2% (20 MG/ML) 5 ML SYRINGE
INTRAMUSCULAR | Status: DC | PRN
  Administered 2019-07-05: 100 mg via INTRAVENOUS

## 2019-07-05 MED ORDER — METHOCARBAMOL 500 MG IVPB - SIMPLE MED
INTRAVENOUS | Status: AC
  Filled 2019-07-05: qty 50

## 2019-07-05 MED ORDER — LACTATED RINGERS IV SOLN: INTRAVENOUS | Status: DC | PRN

## 2019-07-05 MED ORDER — MIDAZOLAM HCL 2 MG/2ML IJ SOLN
INTRAMUSCULAR | Status: AC
  Filled 2019-07-05: qty 2

## 2019-07-05 MED ORDER — HYDRALAZINE HCL 20 MG/ML IJ SOLN
INTRAMUSCULAR | Status: DC | PRN
  Administered 2019-07-05: 10 mg via INTRAVENOUS

## 2019-07-05 MED ORDER — HYDROMORPHONE HCL 2 MG/ML IJ SOLN
INTRAMUSCULAR | Status: AC
  Filled 2019-07-05: qty 1

## 2019-07-05 MED ORDER — METHOCARBAMOL 500 MG IVPB - SIMPLE MED
500.0000 mg | Freq: Four times a day (QID) | INTRAVENOUS | Status: DC | PRN
  Administered 2019-07-05: 10:00:00 500 mg via INTRAVENOUS
  Filled 2019-07-05: qty 50

## 2019-07-05 MED ORDER — METOCLOPRAMIDE HCL 5 MG/ML IJ SOLN: 5.0000 mg | Freq: Three times a day (TID) | INTRAMUSCULAR | Status: DC | PRN

## 2019-07-05 MED ORDER — ACETAMINOPHEN 325 MG PO TABS
325.0000 mg | ORAL_TABLET | Freq: Four times a day (QID) | ORAL | Status: DC | PRN
  Administered 2019-07-08 – 2019-07-09 (×2): 650 mg via ORAL
  Filled 2019-07-05 (×4): qty 2

## 2019-07-05 MED ORDER — ACETAMINOPHEN 10 MG/ML IV SOLN
1000.0000 mg | Freq: Once | INTRAVENOUS | Status: DC | PRN
  Administered 2019-07-05: 1000 mg via INTRAVENOUS

## 2019-07-05 MED ORDER — FENTANYL CITRATE (PF) 250 MCG/5ML IJ SOLN
INTRAMUSCULAR | Status: AC
  Filled 2019-07-05: qty 5

## 2019-07-05 MED ORDER — ONDANSETRON HCL 4 MG/2ML IJ SOLN
INTRAMUSCULAR | Status: DC | PRN
  Administered 2019-07-05: 4 mg via INTRAVENOUS

## 2019-07-05 MED ORDER — SODIUM CHLORIDE (PF) 0.9 % IJ SOLN
INTRAMUSCULAR | Status: AC
  Filled 2019-07-05: qty 10

## 2019-07-05 MED ORDER — PROMETHAZINE HCL 25 MG/ML IJ SOLN: 6.2500 mg | INTRAMUSCULAR | Status: DC | PRN

## 2019-07-05 MED ORDER — KETOROLAC TROMETHAMINE 30 MG/ML IJ SOLN
INTRAMUSCULAR | Status: AC
  Filled 2019-07-05: qty 1

## 2019-07-05 MED ORDER — PROPOFOL 10 MG/ML IV BOLUS
INTRAVENOUS | Status: DC | PRN
  Administered 2019-07-05: 200 mg via INTRAVENOUS

## 2019-07-05 MED ORDER — CEFAZOLIN SODIUM-DEXTROSE 2-3 GM-%(50ML) IV SOLR
INTRAVENOUS | Status: DC | PRN
  Administered 2019-07-05: 2 g via INTRAVENOUS

## 2019-07-05 MED ORDER — 0.9 % SODIUM CHLORIDE (POUR BTL) OPTIME
TOPICAL | Status: DC | PRN
  Administered 2019-07-05: 1000 mL

## 2019-07-05 MED ORDER — SODIUM CHLORIDE 0.9 % IV SOLN: INTRAVENOUS | Status: DC

## 2019-07-05 MED ORDER — OXYCODONE HCL 5 MG PO TABS
5.0000 mg | ORAL_TABLET | ORAL | Status: DC | PRN
  Administered 2019-07-07: 20:00:00 10 mg via ORAL
  Filled 2019-07-05: qty 2

## 2019-07-05 MED ORDER — HYDROMORPHONE HCL 1 MG/ML IJ SOLN
INTRAMUSCULAR | Status: AC
  Filled 2019-07-05: qty 1

## 2019-07-05 MED ORDER — DIPHENHYDRAMINE HCL 12.5 MG/5ML PO ELIX: 12.5000 mg | ORAL_SOLUTION | ORAL | Status: DC | PRN

## 2019-07-05 MED ORDER — LIDOCAINE 2% (20 MG/ML) 5 ML SYRINGE
INTRAMUSCULAR | Status: AC
  Filled 2019-07-05: qty 5

## 2019-07-05 MED ORDER — CEFAZOLIN SODIUM-DEXTROSE 1-4 GM/50ML-% IV SOLN
1.0000 g | Freq: Four times a day (QID) | INTRAVENOUS | Status: AC
  Administered 2019-07-05 – 2019-07-06 (×3): 1 g via INTRAVENOUS
  Filled 2019-07-05 (×3): qty 50

## 2019-07-05 MED ORDER — PROPOFOL 10 MG/ML IV BOLUS
INTRAVENOUS | Status: AC
  Filled 2019-07-05: qty 20

## 2019-07-05 MED ORDER — FENTANYL CITRATE (PF) 250 MCG/5ML IJ SOLN
INTRAMUSCULAR | Status: DC | PRN
  Administered 2019-07-05 (×2): 50 ug via INTRAVENOUS
  Administered 2019-07-05: 100 ug via INTRAVENOUS
  Administered 2019-07-05 (×3): 50 ug via INTRAVENOUS

## 2019-07-05 MED ORDER — MIDAZOLAM HCL 2 MG/2ML IJ SOLN
INTRAMUSCULAR | Status: DC | PRN
  Administered 2019-07-05: 2 mg via INTRAVENOUS

## 2019-07-05 MED ORDER — ONDANSETRON HCL 4 MG PO TABS: 4.0000 mg | ORAL_TABLET | Freq: Four times a day (QID) | ORAL | Status: DC | PRN

## 2019-07-05 MED ORDER — ROCURONIUM BROMIDE 10 MG/ML (PF) SYRINGE
PREFILLED_SYRINGE | INTRAVENOUS | Status: DC | PRN
  Administered 2019-07-05: 70 mg via INTRAVENOUS
  Administered 2019-07-05: 10 mg via INTRAVENOUS

## 2019-07-05 MED ORDER — ONDANSETRON HCL 4 MG/2ML IJ SOLN: 4.0000 mg | Freq: Four times a day (QID) | INTRAMUSCULAR | Status: DC | PRN

## 2019-07-05 MED ORDER — OXYCODONE HCL 5 MG/5ML PO SOLN: 5.0000 mg | Freq: Once | ORAL | Status: DC | PRN

## 2019-07-05 MED ORDER — HYDROMORPHONE HCL 1 MG/ML IJ SOLN
INTRAMUSCULAR | Status: DC | PRN
  Administered 2019-07-05 (×4): .5 mg via INTRAVENOUS

## 2019-07-05 MED ORDER — MIDAZOLAM HCL 2 MG/2ML IJ SOLN
0.5000 mg | INTRAMUSCULAR | Status: AC
  Administered 2019-07-05: 10:00:00 0.5 mg via INTRAVENOUS

## 2019-07-05 MED ORDER — OXYCODONE HCL 5 MG PO TABS: 5.0000 mg | ORAL_TABLET | Freq: Once | ORAL | Status: DC | PRN

## 2019-07-05 MED ORDER — FENTANYL CITRATE (PF) 100 MCG/2ML IJ SOLN
INTRAMUSCULAR | Status: AC
  Filled 2019-07-05: qty 2

## 2019-07-05 MED ORDER — GABAPENTIN 100 MG PO CAPS
100.0000 mg | ORAL_CAPSULE | Freq: Three times a day (TID) | ORAL | Status: DC
  Administered 2019-07-05 – 2019-07-10 (×14): 100 mg via ORAL
  Filled 2019-07-05 (×14): qty 1

## 2019-07-05 MED ORDER — KETAMINE HCL 10 MG/ML IJ SOLN
INTRAMUSCULAR | Status: DC | PRN
  Administered 2019-07-05: 100 mg via INTRAVENOUS

## 2019-07-05 MED ORDER — KETAMINE HCL 10 MG/ML IJ SOLN
INTRAMUSCULAR | Status: AC
  Filled 2019-07-05: qty 1

## 2019-07-05 MED ORDER — HYDRALAZINE HCL 20 MG/ML IJ SOLN
INTRAMUSCULAR | Status: AC
  Filled 2019-07-05: qty 1

## 2019-07-05 MED ORDER — ROCURONIUM BROMIDE 10 MG/ML (PF) SYRINGE
PREFILLED_SYRINGE | INTRAVENOUS | Status: AC
  Filled 2019-07-05: qty 10

## 2019-07-05 MED ORDER — CEFAZOLIN SODIUM-DEXTROSE 2-4 GM/100ML-% IV SOLN
INTRAVENOUS | Status: AC
  Filled 2019-07-05: qty 100

## 2019-07-05 MED ORDER — SUGAMMADEX SODIUM 500 MG/5ML IV SOLN
INTRAVENOUS | Status: AC
  Filled 2019-07-05: qty 5

## 2019-07-05 MED ORDER — HYDROMORPHONE HCL 1 MG/ML IJ SOLN
0.5000 mg | INTRAMUSCULAR | Status: DC | PRN
  Administered 2019-07-06 – 2019-07-08 (×5): 1 mg via INTRAVENOUS
  Filled 2019-07-05 (×5): qty 1

## 2019-07-05 MED ORDER — DEXAMETHASONE SODIUM PHOSPHATE 10 MG/ML IJ SOLN
INTRAMUSCULAR | Status: AC
  Filled 2019-07-05: qty 1

## 2019-07-05 MED ORDER — DOCUSATE SODIUM 100 MG PO CAPS
100.0000 mg | ORAL_CAPSULE | Freq: Two times a day (BID) | ORAL | Status: DC
  Administered 2019-07-05 – 2019-07-09 (×8): 100 mg via ORAL
  Filled 2019-07-05 (×9): qty 1

## 2019-07-05 MED ORDER — OXYCODONE HCL 5 MG PO TABS
10.0000 mg | ORAL_TABLET | ORAL | Status: DC | PRN
  Administered 2019-07-05 – 2019-07-10 (×11): 15 mg via ORAL
  Filled 2019-07-05 (×5): qty 3

## 2019-07-05 MED ORDER — ACETAMINOPHEN 10 MG/ML IV SOLN
INTRAVENOUS | Status: AC
  Filled 2019-07-05: qty 100

## 2019-07-05 MED ORDER — HYDROMORPHONE HCL 1 MG/ML IJ SOLN
0.2500 mg | INTRAMUSCULAR | Status: DC | PRN
  Administered 2019-07-05 (×4): 0.5 mg via INTRAVENOUS

## 2019-07-05 MED ORDER — KETOROLAC TROMETHAMINE 30 MG/ML IJ SOLN
INTRAMUSCULAR | Status: DC | PRN
  Administered 2019-07-05: 30 mg via INTRAVENOUS

## 2019-07-05 MED ORDER — ONDANSETRON HCL 4 MG/2ML IJ SOLN
INTRAMUSCULAR | Status: AC
  Filled 2019-07-05: qty 2

## 2019-07-05 MED ORDER — SUGAMMADEX SODIUM 500 MG/5ML IV SOLN
INTRAVENOUS | Status: DC | PRN
  Administered 2019-07-05: 400 mg via INTRAVENOUS

## 2019-07-05 MED ORDER — METOCLOPRAMIDE HCL 5 MG PO TABS: 5.0000 mg | ORAL_TABLET | Freq: Three times a day (TID) | ORAL | Status: DC | PRN

## 2019-07-05 MED ORDER — METHOCARBAMOL 500 MG PO TABS
500.0000 mg | ORAL_TABLET | Freq: Four times a day (QID) | ORAL | Status: DC | PRN
  Administered 2019-07-05 – 2019-07-10 (×5): 500 mg via ORAL
  Filled 2019-07-05 (×9): qty 1

## 2019-07-05 MED ORDER — DEXAMETHASONE SODIUM PHOSPHATE 10 MG/ML IJ SOLN
INTRAMUSCULAR | Status: DC | PRN
  Administered 2019-07-05: 10 mg via INTRAVENOUS

## 2019-07-05 SURGICAL SUPPLY — 55 items
BAG ZIPLOCK 12X15 (MISCELLANEOUS) ×2 IMPLANT
BIT DRILL FREE HAND 4.2X130 (DRILL) ×1 IMPLANT
BIT DRILL LOCK 4.2X360 (DRILL) ×1 IMPLANT
BNDG ELASTIC 4X5.8 VLCR STR LF (GAUZE/BANDAGES/DRESSINGS) ×2 IMPLANT
BNDG ELASTIC 6X5.8 VLCR STR LF (GAUZE/BANDAGES/DRESSINGS) ×2 IMPLANT
CLOTH BEACON ORANGE TIMEOUT ST (SAFETY) ×2 IMPLANT
COVER SURGICAL LIGHT HANDLE (MISCELLANEOUS) ×2 IMPLANT
COVER WAND RF STERILE (DRAPES) IMPLANT
CUFF TOURN SGL QUICK 34 (TOURNIQUET CUFF)
CUFF TRNQT CYL 34X4.125X (TOURNIQUET CUFF) IMPLANT
DRAPE C-ARM 42X120 X-RAY (DRAPES) ×2 IMPLANT
DRAPE C-ARMOR (DRAPES) ×2 IMPLANT
DRAPE U-SHAPE 47X51 STRL (DRAPES) ×4 IMPLANT
DRAPE UTILITY XL STRL (DRAPES) ×4 IMPLANT
DRILL FREE HAND 4.2X130 (DRILL) ×2
DRILL LOCK 4.2X360 (DRILL) ×2
DRSG PAD ABDOMINAL 8X10 ST (GAUZE/BANDAGES/DRESSINGS) ×4 IMPLANT
DURAPREP 26ML APPLICATOR (WOUND CARE) ×2 IMPLANT
ELECT REM PT RETURN 15FT ADLT (MISCELLANEOUS) ×2 IMPLANT
GAUZE SPONGE 4X4 12PLY STRL (GAUZE/BANDAGES/DRESSINGS) ×4 IMPLANT
GAUZE XEROFORM 5X9 LF (GAUZE/BANDAGES/DRESSINGS) ×2 IMPLANT
GLOVE BIO SURGEON STRL SZ7.5 (GLOVE) ×4 IMPLANT
GLOVE BIOGEL PI IND STRL 8 (GLOVE) ×2 IMPLANT
GLOVE BIOGEL PI INDICATOR 8 (GLOVE) ×2
GLOVE ECLIPSE 8.0 STRL XLNG CF (GLOVE) ×2 IMPLANT
GOWN STRL REUS W/TWL LRG LVL3 (GOWN DISPOSABLE) ×2 IMPLANT
GOWN STRL REUS W/TWL XL LVL3 (GOWN DISPOSABLE) ×4 IMPLANT
GUIDEROD T2 3X1000 (ROD) ×2 IMPLANT
K-WIRE 3X285 (WIRE) ×2
K-WIRE FIXATION 3X285 COATED (WIRE) ×4
KIT TURNOVER KIT A (KITS) IMPLANT
KWIRE 3X285 (WIRE) ×1 IMPLANT
KWIRE FIXATION 3X285 COATED (WIRE) ×2 IMPLANT
MANIFOLD NEPTUNE II (INSTRUMENTS) ×2 IMPLANT
NAIL ELAS INSERT SLV SPI 8-11 (MISCELLANEOUS) ×2 IMPLANT
NAIL T2 ALPHA TIBIA 10X360 (Miscellaneous) ×1 IMPLANT
NAILT2 ALPHA TIBIA 10X360 (Miscellaneous) ×2 IMPLANT
NS IRRIG 1000ML POUR BTL (IV SOLUTION) ×2 IMPLANT
PACK TOTAL KNEE CUSTOM (KITS) ×2 IMPLANT
PAD CAST 4YDX4 CTTN HI CHSV (CAST SUPPLIES) ×2 IMPLANT
PADDING CAST COTTON 4X4 STRL (CAST SUPPLIES) ×4
PADDING CAST COTTON 6X4 STRL (CAST SUPPLIES) ×2 IMPLANT
PADDING CAST SYNTHETIC 4 (CAST SUPPLIES) ×1
PADDING CAST SYNTHETIC 4X4 STR (CAST SUPPLIES) ×1 IMPLANT
PENCIL SMOKE EVACUATOR (MISCELLANEOUS) IMPLANT
PROTECTOR NERVE ULNAR (MISCELLANEOUS) ×2 IMPLANT
REAMER INTRAMEDULLARY 8MM 510 (MISCELLANEOUS) ×2 IMPLANT
SCREW LOCK T2 5X40 (Screw) ×4 IMPLANT
SCREW LOCK T2 5X50 (Screw) ×2 IMPLANT
SCREW LOCK T2 5X65 (Screw) ×2 IMPLANT
SUT VIC AB 0 CT1 27 (SUTURE) ×4
SUT VIC AB 0 CT1 27XBRD ANTBC (SUTURE) ×2 IMPLANT
SUT VIC AB 2-0 CT1 27 (SUTURE) ×4
SUT VIC AB 2-0 CT1 TAPERPNT 27 (SUTURE) ×2 IMPLANT
WATER STERILE IRR 1000ML POUR (IV SOLUTION) ×2 IMPLANT

## 2019-07-05 NOTE — Progress Notes (Signed)
Report received form Janeen RN/ED pt received to room 1322 and transferred from stretcher to bed and aligned for comfort. Education to pt on use of call bell for needs/safety and bed alarm set.

## 2019-07-05 NOTE — Op Note (Signed)
NAMEFOSTER, FRERICKS MEDICAL RECORD OJ:[id number] ACCOUNT [id number] DATE OF BIRTH:09-14-1975 FACILITY: [facility] LOCATION: [facility] PHYSICIAN:Tirzah Fross Kerry Fort, MD  OPERATIVE REPORT  DATE OF PROCEDURE:  07/05/2019  PREOPERATIVE DIAGNOSIS:  Right distal third closed tibia/fibula shaft fracture.  POSTOPERATIVE DIAGNOSIS:  Right distal third closed tibia/fibula shaft fracture.  PROCEDURE:  Intramedullary nail placement, right tibia.  IMPLANTS:  Stryker suprapatellar knee measuring 10 x 360 with 2 proximal and 2 distal interlocking screws.  SURGEON:  Lind Guest.  Ninfa Linden, MD  ASSISTANT:  Erskine Emery, PA-C  ANESTHESIA:  General.  ANTIBIOTICS:  Two g IV Ancef.  ESTIMATED BLOOD LOSS:  50 mL.  COMPLICATIONS:  None.  INDICATIONS:  The patient is a 44 year old gentleman who was doing some type of horsing around as he describes it in his yard and even doing some kickboxing, when he sustained a very hard mechanical fall, injuring his right leg.  He was brought to the  [facility] Emergency Room via EMS and found to have a tibial shaft fracture.  This was the distal third of the tibial shaft.  He has no evidence of compartment syndrome, so we were able to give him some light sedation and get him out to length in a  splint.  We then admitted him overnight for observation, with bringing him in to the operating room this morning for intramedullary nail placement to stabilize the tibia fracture.  I had a long and thorough discussion with him about this injury.  I did  recognize after scrutinizing the x-rays that there is a nondisplaced fracture in the metaphyseal section of the right, tibia along with the tibia and fibula shaft fracture of the distal third.  This still can be stabilized with the same intramedullary  nail.  I had a long and thorough discussion about the risk of acute blood loss anemia, nerve or vessel injury, infection, DVT, nonunion, implant failure.  He is a smoker, so he has  a higher potential nonunion rate.  I counseled him about this as well.  DESCRIPTION OF PROCEDURE:  After informed consent was obtained, the appropriate right leg was marked.  He was brought to the operating room where general anesthesia was obtained while he was on a stretcher.  He was then placed in a supine position on the  operating table.  The splint was removed from his leg.  There was some minimal fracture blistering, but the compartments were soft.  On clinical exam, he had palpable pulse in his foot.  Of note, preoperatively he also reported normal sensation in his  foot.  We then had to perform a prescrub to just clean a lot of grass off the bottom of his foot and other debris.  Once we got a good and thorough prescrub, we prepped his thigh, knee, leg, ankle and foot with DuraPrep and sterile drapes.  A timeout was  called to identify correct patient and correct right leg.  We then performed a suprapatellar approach to the knee, dissecting just proximal to the superior pole of the patella.  We dissected down to the quad tendon and split the quad tendon  longitudinally.  Using a soft tissue protection guide, we were then under direct fluoroscopic guidance, able to insert a temporary pin in the proximal tibia to the metaphyseal region.  We verified its placement under AP and lateral fluoroscopic planes.   We then were able to use initially a reamer to open up the tibial canal.  I then placed a long guide rod down  the tibia, following the fracture reduced and putting this into the distal ankle near the physeal scar.  We took a measurement off of this and  we chose a 360 length tibial nail.  We then began reaming in 5 mm increments from a size 9, going up to a size 11.5, getting good chatter.  With that being said, we chose a 10 x 360 tibial nail.  We then passed this along the guide rod in an antegrade  fashion and once we passed the fracture site, holding the fracture reduced, we removed the  temporary guidewire.   We were then pleased with the final placement of the rod itself.  Two proximal interlocking screws, 1 from anterolateral to posteromedial  and 1 from medial to lateral were placed proximally and 2 mediolateral distal interlocking screws were placed.  We then removed all instrumentation from the knee and took our final x-rays and again we were pleased with the anatomic alignment.  We then  irrigated all wounds, including irrigating out the knee joint with normal saline solution.  We closed the quad tendon area with interrupted #1 Vicryl suture, followed by using 0 Vicryl to close the deep tissue and 2-0 Vicryl was used to close the  subcutaneous tissue.  All interlocking incisions and the final knee incisions were closed in terms of the skin with staples.  Xeroform and well-padded sterile dressing was applied.  He was awakened, extubated, and taken to the recovery room in stable  condition.  All final counts were correct.  There were  no complications noted.  Due to the nature of his fracture being 2 segments of the tibia, we will allow touchdown weightbearing for the next 4-6 weeks.  Of note Rexene Edison, PA-C, assisted in the  entire case.  His assistance was crucial for facilitating all aspects of this case.  VN/NUANCE  D:07/05/2019 T:07/05/2019 JOB:[id number]

## 2019-07-05 NOTE — Anesthesia Procedure Notes (Signed)
Procedure Name: Intubation Date/Time: 07/05/2019 7:34 AM Performed by: Florene Route, CRNA Patient Re-evaluated:Patient Re-evaluated prior to induction Oxygen Delivery Method: Circle system utilized Preoxygenation: Pre-oxygenation with 100% oxygen Induction Type: IV induction Ventilation: Mask ventilation without difficulty and Oral airway inserted - appropriate to patient size Laryngoscope Size: Miller and 3 Grade View: Grade I Tube type: Oral Tube size: 8.0 mm Number of attempts: 1 Airway Equipment and Method: Stylet Placement Confirmation: ETT inserted through vocal cords under direct vision,  positive ETCO2 and breath sounds checked- equal and bilateral Secured at: 22 cm Tube secured with: Tape Dental Injury: Teeth and Oropharynx as per pre-operative assessment

## 2019-07-05 NOTE — Transfer of Care (Signed)
Immediate Anesthesia Transfer of Care Note  Patient: Daniel Solis  Procedure(s) Performed: INTRAMEDULLARY (IM) NAIL TIBIAL (Right Leg Lower)  Patient Location: PACU  Anesthesia Type:General  Level of Consciousness: awake, alert  and oriented  Airway & Oxygen Therapy: Patient Spontanous Breathing and Patient connected to face mask oxygen  Post-op Assessment: Report given to RN and Post -op Vital signs reviewed and stable  Post vital signs: Reviewed and stable  Last Vitals:  Vitals Value Taken Time  BP    Temp    Pulse 80 07/05/19 0917  Resp 12 07/05/19 0917  SpO2 98 % 07/05/19 0917  Vitals shown include unvalidated device data.  Last Pain:  Vitals:   07/05/19 0623  TempSrc: Oral  PainSc:          Complications: No apparent anesthesia complications

## 2019-07-05 NOTE — Anesthesia Preprocedure Evaluation (Signed)
Anesthesia Evaluation  Patient identified by MRN, date of birth, ID band Patient awake    Reviewed: Allergy & Precautions, NPO status , Patient's Chart, lab work & pertinent test results  Airway Mallampati: II  TM Distance: >3 FB Neck ROM: Full    Dental no notable dental hx.    Pulmonary Current Smoker,    Pulmonary exam normal breath sounds clear to auscultation       Cardiovascular hypertension, Normal cardiovascular exam Rhythm:Regular Rate:Normal     Neuro/Psych TIAnegative psych ROS   GI/Hepatic negative GI ROS, (+) Hepatitis -, C  Endo/Other  negative endocrine ROS  Renal/GU negative Renal ROS  negative genitourinary   Musculoskeletal negative musculoskeletal ROS (+)   Abdominal   Peds negative pediatric ROS (+)  Hematology negative hematology ROS (+)   Anesthesia Other Findings   Reproductive/Obstetrics negative OB ROS                             Anesthesia Physical Anesthesia Plan  ASA: III  Anesthesia Plan: General   Post-op Pain Management:    Induction: Intravenous  PONV Risk Score and Plan: 2 and Ondansetron, Dexamethasone and Treatment may vary due to age or medical condition  Airway Management Planned: Oral ETT  Additional Equipment:   Intra-op Plan:   Post-operative Plan: Extubation in OR  Informed Consent: I have reviewed the patients History and Physical, chart, labs and discussed the procedure including the risks, benefits and alternatives for the proposed anesthesia with the patient or authorized representative who has indicated his/her understanding and acceptance.     Dental advisory given  Plan Discussed with: CRNA and Surgeon  Anesthesia Plan Comments:         Anesthesia Quick Evaluation

## 2019-07-05 NOTE — Brief Op Note (Signed)
07/04/2019 - 07/05/2019  8:59 AM  PATIENT:  Daniel Solis  44 y.o. male  PRE-OPERATIVE DIAGNOSIS:  RIGHT TIB-FIB FRACTURE  POST-OPERATIVE DIAGNOSIS:  right tib-fib fracture  PROCEDURE:  Procedure(s): INTRAMEDULLARY (IM) NAIL TIBIAL (Right)  SURGEON:  Surgeon(s) and Role:    Kathryne Hitch, MD - Primary  PHYSICIAN ASSISTANT:  Rexene Edison, PA-C  ANESTHESIA:   general  EBL:  50 mL   COUNTS:  YES  DICTATION: .Other Dictation: Dictation Number [id number]  PLAN OF CARE: Admit to inpatient   PATIENT DISPOSITION:  PACU - hemodynamically stable.   Delay start of Pharmacological VTE agent (>24hrs) due to surgical blood loss or risk of bleeding: no

## 2019-07-05 NOTE — Anesthesia Postprocedure Evaluation (Signed)
Anesthesia Post Note  Patient: Marguarite Arbour  Procedure(s) Performed: INTRAMEDULLARY (IM) NAIL TIBIAL (Right Leg Lower)     Patient location during evaluation: PACU Anesthesia Type: General Level of consciousness: awake and alert Pain management: pain level controlled Vital Signs Assessment: post-procedure vital signs reviewed and stable Respiratory status: spontaneous breathing, nonlabored ventilation, respiratory function stable and patient connected to nasal cannula oxygen Cardiovascular status: blood pressure returned to baseline and stable Postop Assessment: no apparent nausea or vomiting Anesthetic complications: no    Last Vitals:  Vitals:   07/05/19 0917 07/05/19 0930  BP: (!) 129/94 (!) 154/113  Pulse: 80 78  Resp: 12 17  Temp: 36.7 C   SpO2: 98% 100%    Last Pain:  Vitals:   07/05/19 0917  TempSrc:   PainSc: 5                  Modean Mccullum S

## 2019-07-05 NOTE — Plan of Care (Signed)
  Problem: Coping: Goal: Level of anxiety will decrease Outcome: Progressing   Problem: Elimination: Goal: Will not experience complications related to urinary retention Outcome: Progressing   Problem: Pain Managment: Goal: General experience of comfort will improve Outcome: Progressing   

## 2019-07-06 NOTE — Plan of Care (Signed)

## 2019-07-06 NOTE — Evaluation (Signed)
Physical Therapy Evaluation Patient Details Name: Daniel Solis MRN: [id number] DOB: 19-Nov-1975 Today's Date: 07/06/2019   History of Present Illness  Pt s/p R tib/fib fx and now s/p IM nailing.  Pt with hx of TIA and ADD  Clinical Impression  Pt admitted as above and presenting with functional mobility limitations 2* decreased R LE strength/ROM, NWB on R LE and significant post op pain.  Pt very limited this session by pain but possessing physical strength needed to manage mobility and dc to home with family assist once pain with movement controlled sufficiently.    Follow Up Recommendations Home health PT(dependent on acute stay progress)    Equipment Recommendations  Other (comment);3in1 (PT)(TBD after more comprehensive eval)    Recommendations for Other Services OT consult     Precautions / Restrictions Precautions Precautions: Fall Restrictions Weight Bearing Restrictions: Yes RLE Weight Bearing: Non weight bearing      Mobility  Bed Mobility Overal bed mobility: Needs Assistance Bed Mobility: Supine to Sit     Supine to sit: Min assist     General bed mobility comments: Pt able to prop up onto elbows in bed, bend L LE up and move 50% toward EOB with assist to manage R LE   Transfers                 General transfer comment: Unable to progress to transfers 2* pain  Ambulation/Gait                Stairs            Wheelchair Mobility    Modified Rankin (Stroke Patients Only)       Balance                                             Pertinent Vitals/Pain Pain Assessment: 0-10 Pain Score: 10-Worst pain ever Pain Location: R lower leg Pain Descriptors / Indicators: Aching;Burning;Grimacing;Guarding;Throbbing Pain Intervention(s): Limited activity within patient's tolerance;Monitored during session;Premedicated before session;Patient requesting pain meds-RN notified;Ice applied    Home Living Family/patient  expects to be discharged to:: Private residence Living Arrangements: Spouse/significant other;Children Available Help at Discharge: Family Type of Home: House Home Access: Stairs to enter Entrance Stairs-Rails: None Secretary/administrator of Steps: 1 Home Layout: One level Home Equipment: None      Prior Function Level of Independence: Independent         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 Extremity Assessment Upper Extremity Assessment: Overall WFL for tasks assessed    Lower Extremity Assessment Lower Extremity Assessment: RLE deficits/detail RLE Deficits / Details: multiple dressings in place; movement pain ltd RLE: Unable to fully assess due to pain    Cervical / Trunk Assessment Cervical / Trunk Assessment: Normal  Communication   Communication: No difficulties  Cognition Arousal/Alertness: Awake/alert Behavior During Therapy: Anxious Overall Cognitive Status: Within Functional Limits for tasks assessed                                        General Comments      Exercises     Assessment/Plan    PT Assessment Patient needs continued PT services  PT Problem List Decreased  range of motion;Decreased activity tolerance;Decreased balance;Decreased mobility;Decreased knowledge of use of DME;Pain       PT Treatment Interventions DME instruction;Gait training;Stair training;Functional mobility training;Therapeutic activities;Therapeutic exercise;Patient/family education    PT Goals (Current goals can be found in the Care Plan section)  Acute Rehab PT Goals Patient Stated Goal: Improved pain control to allow return to Modified IND mobility PT Goal Formulation: With patient Time For Goal Achievement: 07/20/19 Potential to Achieve Goals: Good    Frequency Min 6X/week   Barriers to discharge        Co-evaluation               AM-PAC PT "6 Clicks" Mobility  Outcome Measure  Help needed turning from your back to your side while in a flat bed without using bedrails?: A Lot Help needed moving from lying on your back to sitting on the side of a flat bed without using bedrails?: A Lot Help needed moving to and from a bed to a chair (including a wheelchair)?: Total Help needed standing up from a chair using your arms (e.g., wheelchair or bedside chair)?: Total Help needed to walk in hospital room?: Total Help needed climbing 3-5 steps with a railing? : Total 6 Click Score: 8    End of Session   Activity Tolerance: Patient limited by pain Patient left: in bed;with call bell/phone within reach;with bed alarm set Nurse Communication: Mobility status;Patient requests pain meds PT Visit Diagnosis: Difficulty in walking, not elsewhere classified (R26.2);Pain Pain - Right/Left: Right Pain - part of body: Leg    Time: 1245-1302 PT Time Calculation (min) (ACUTE ONLY): 17 min   Charges:   PT Evaluation $PT Eval Low Complexity: 1 Low          [place] Pager [phone] Office [phone]   Maizee Reinhold 07/06/2019, 2:31 PM

## 2019-07-06 NOTE — Progress Notes (Signed)
Patient ID: Daniel Solis, male   DOB: 04-27-1975, 44 y.o.   MRN: [id number] The patient is postop day 1 status post intramedullary nail placement in his right tibia to stabilize the tib-fib fracture.  He is having a considerable amount of pain.  This was a complex fracture.  He does not show any evidence of compartment syndrome.  He is able to move his toes and ankle.  He has normal sensation over his foot and his foot is well-perfused.  All incisions are clean and dry and we did place new dressing.  His calf is also soft.  He understands now the importance of physical therapy and mobility.  We are going to have him just touchdown weightbearing only on his right lower extremity due to the fact that there is a fracture in the metaphyseal section of the tibia as well as distally.  This should heal hopefully with time.  He is a smoker and was counseled about this.  He does have nicotine patches.  He did not mobilize any yesterday and I think this is secondary to being just after surgery and due to his pain.  Hopefully he will be able to mobilize today with a goal of getting him home in the next 1 to 2 days.

## 2019-07-07 DIAGNOSIS — F17200 Nicotine dependence, unspecified, uncomplicated: Secondary | ICD-10-CM | POA: Diagnosis not present

## 2019-07-07 DIAGNOSIS — F10230 Alcohol dependence with withdrawal, uncomplicated: Secondary | ICD-10-CM

## 2019-07-07 DIAGNOSIS — S82201A Unspecified fracture of shaft of right tibia, initial encounter for closed fracture: Secondary | ICD-10-CM | POA: Diagnosis not present

## 2019-07-07 DIAGNOSIS — K760 Fatty (change of) liver, not elsewhere classified: Secondary | ICD-10-CM

## 2019-07-07 LAB — PHOSPHORUS: Phosphorus: 2.9 mg/dL (ref 2.5–4.6)

## 2019-07-07 MED ORDER — LORAZEPAM 1 MG PO TABS
1.0000 mg | ORAL_TABLET | ORAL | Status: DC | PRN
  Administered 2019-07-07: 20:00:00 2 mg via ORAL
  Administered 2019-07-07 – 2019-07-08 (×2): 1 mg via ORAL
  Administered 2019-07-08 – 2019-07-09 (×5): 2 mg via ORAL
  Filled 2019-07-07 (×7): qty 2
  Filled 2019-07-07: qty 1
  Filled 2019-07-07: qty 2

## 2019-07-07 MED ORDER — FOLIC ACID 1 MG PO TABS
1.0000 mg | ORAL_TABLET | Freq: Every day | ORAL | Status: DC
  Administered 2019-07-07 – 2019-07-10 (×4): 1 mg via ORAL
  Filled 2019-07-07 (×4): qty 1

## 2019-07-07 MED ORDER — ADULT MULTIVITAMIN W/MINERALS CH: 1.0000 | ORAL_TABLET | Freq: Every day | ORAL | Status: DC

## 2019-07-07 MED ORDER — LORAZEPAM 2 MG/ML IJ SOLN: 1.0000 mg | INTRAMUSCULAR | Status: DC | PRN

## 2019-07-07 MED ORDER — THIAMINE HCL 100 MG PO TABS
100.0000 mg | ORAL_TABLET | Freq: Every day | ORAL | Status: DC
  Administered 2019-07-07 – 2019-07-10 (×4): 100 mg via ORAL
  Filled 2019-07-07 (×4): qty 1

## 2019-07-07 MED ORDER — NICOTINE 14 MG/24HR TD PT24
14.0000 mg | MEDICATED_PATCH | Freq: Every day | TRANSDERMAL | Status: DC
  Administered 2019-07-09 – 2019-07-10 (×2): 14 mg via TRANSDERMAL
  Filled 2019-07-07 (×3): qty 1

## 2019-07-07 MED ORDER — NICOTINE 21 MG/24HR TD PT24: 21.0000 mg | MEDICATED_PATCH | Freq: Every day | TRANSDERMAL | Status: DC

## 2019-07-07 MED ORDER — ASPIRIN 81 MG PO CHEW
81.0000 mg | CHEWABLE_TABLET | Freq: Two times a day (BID) | ORAL | Status: DC
  Administered 2019-07-07 – 2019-07-10 (×7): 81 mg via ORAL
  Filled 2019-07-07 (×7): qty 1

## 2019-07-07 MED ORDER — THIAMINE HCL 100 MG/ML IJ SOLN: 100.0000 mg | Freq: Every day | INTRAMUSCULAR | Status: DC

## 2019-07-07 NOTE — Progress Notes (Signed)
Patient ID: Daniel Solis, male   DOB: [birth year], 44 y.o.   MRN: [id number] Very slow progress with his mobility/therapy secondary to pain tolerance.  Will have therapy work with him again today with the goal of discharge to home in the near future.  Right leg is otherwise stable.

## 2019-07-07 NOTE — Progress Notes (Signed)
Physical Therapy Treatment Patient Details Name: Daniel Solis MRN: [id number] DOB: June 09, 1975 Today's Date: 07/07/2019    History of Present Illness Pt s/p R tib/fib fx and now s/p IM nailing.  Pt with hx of TIA, ADD, EtOH. Pt now going through EtOH withdrawal.    PT Comments    Per RN, pt is starting to go through EtOH withdrawal. Min assist for supine to sit, min A sit to stand, pt ambulated 4' with RW, distance limited by pain. Performed RLE exercises with min assist. All mobility limited by pain. At present, he will need a WC due to very limited ambulation tolerance.    Follow Up Recommendations  Home health PT(dependent on acute stay progress)     Equipment Recommendations  3in1 (PT);Rolling walker with 5" wheels;Wheelchair (measurements PT);Wheelchair cushion (measurements PT)    Recommendations for Other Services OT consult     Precautions / Restrictions Precautions Precautions: Fall Restrictions Weight Bearing Restrictions: Yes RLE Weight Bearing: Touchdown weight bearing    Mobility  Bed Mobility Overal bed mobility: Needs Assistance Bed Mobility: Supine to Sit     Supine to sit: Min assist;HOB elevated     General bed mobility comments: looped gait belt around R foot, pt used this as a leg lifter; min A to support LLE, used bed rail  Transfers Overall transfer level: Needs assistance Equipment used: Rolling walker (2 wheeled) Transfers: Sit to/from Stand Sit to Stand: Min guard;From elevated surface         General transfer comment: VCs hand placement  Ambulation/Gait Ambulation/Gait assistance: Min guard Gait Distance (Feet): 4 Feet Assistive device: Rolling walker (2 wheeled) Gait Pattern/deviations: Step-to pattern;Decreased stride length Gait velocity: decr   General Gait Details: instructed pt in TDWB status, VCs sequencing, distance limited by pain   Stairs             Wheelchair Mobility    Modified Rankin (Stroke Patients  Only)       Balance Overall balance assessment: Needs assistance   Sitting balance-Leahy Scale: Good     Standing balance support: Bilateral upper extremity supported Standing balance-Leahy Scale: Poor Standing balance comment: relies on BUE support                            Cognition Arousal/Alertness: Awake/alert Behavior During Therapy: Anxious Overall Cognitive Status: Within Functional Limits for tasks assessed                                        Exercises General Exercises - Lower Extremity Ankle Circles/Pumps: AROM;Both;10 reps;Supine Quad Sets: AROM;Right;5 reps;Supine Hip ABduction/ADduction: AAROM;Right;5 reps;Supine Straight Leg Raises: AAROM;Right;5 reps;Supine    General Comments        Pertinent Vitals/Pain Pain Score: 8  Pain Location: R knee Pain Descriptors / Indicators: Sharp Pain Intervention(s): Limited activity within patient's tolerance;Monitored during session;Premedicated before session;Ice applied    Home Living                      Prior Function            PT Goals (current goals can now be found in the care plan section) Acute Rehab PT Goals 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 PT Goal Formulation: With patient Time For Goal Achievement: 07/20/19 Potential to Achieve Goals: Good Progress towards PT goals:  Progressing toward goals    Frequency    Min 6X/week      PT Plan Current plan remains appropriate    Co-evaluation              AM-PAC PT "6 Clicks" Mobility   Outcome Measure  Help needed turning from your back to your side while in a flat bed without using bedrails?: A Little Help needed moving from lying on your back to sitting on the side of a flat bed without using bedrails?: A Little Help needed moving to and from a bed to a chair (including a wheelchair)?: A Little Help needed standing up from a chair using your arms (e.g., wheelchair or bedside chair)?:  A Little Help needed to walk in hospital room?: A Lot Help needed climbing 3-5 steps with a railing? : Total 6 Click Score: 15    End of Session Equipment Utilized During Treatment: Gait belt Activity Tolerance: Patient limited by pain Patient left: with call bell/phone within reach;in chair;with chair alarm set Nurse Communication: Mobility status PT Visit Diagnosis: Difficulty in walking, not elsewhere classified (R26.2);Pain Pain - Right/Left: Right Pain - part of body: Leg     Time: 5397-6734 PT Time Calculation (min) (ACUTE ONLY): 25 min  Charges:  $Gait Training: 8-22 mins $Therapeutic Exercise: 8-22 mins                    Ralene Bathe Kistler PT 07/07/2019  Acute Rehabilitation Services Pager [phone] Office [phone]

## 2019-07-07 NOTE — Progress Notes (Signed)
Patient and wife stated that patient is an alcoholic and that he hasnt had a drink since Friday and that he will go into withdrawal at 3-4 days. Dr Magnus Ivan made aware D Susann Penick RN

## 2019-07-07 NOTE — Progress Notes (Signed)
Patient ID: Daniel Solis, male   DOB: 07-24-1975, 44 y.o.   MRN: [id number] Patient reports drinking 1 pint of vodka and 1 to 2 40 oz beers per day. Will get medical consult due to patients ETOH and nicotine withdraw. Patient states he wants to quit both but has been unable to quit in the past.
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
## 2019-07-07 NOTE — Progress Notes (Signed)
Patient suffers from R tibia/fibula fracture which impairs their ability to perform daily activities like ambulation in the home.  A walker alone will not resolve the issues with performing activities of daily living. A lightweight wheelchair will allow patient to safely perform daily activities.  The patient can self propel in the home or has a caregiver who can provide assistance.      Ralene Bathe Kistler PT 07/07/2019  Acute Rehabilitation Services Pager [phone] Office [phone]

## 2019-07-08 DIAGNOSIS — S82401A Unspecified fracture of shaft of right fibula, initial encounter for closed fracture: Secondary | ICD-10-CM | POA: Diagnosis not present

## 2019-07-08 DIAGNOSIS — S82201A Unspecified fracture of shaft of right tibia, initial encounter for closed fracture: Secondary | ICD-10-CM | POA: Diagnosis not present

## 2019-07-08 NOTE — Progress Notes (Signed)
Patient ID: Daniel Solis, male   DOB: 29-Jun-1975, 44 y.o.   MRN: [id number] No acute changes.  Vitals stable.  Appreciate Hospitalist consult to address the possibility of ETOH withdrawal given the patient's ETOH use.  Working slowly with therapy.  Hopefully can progress enough to discharge to home soon (?tomorrow).

## 2019-07-08 NOTE — TOC Initial Note (Signed)
Transition of Care [facility]) - Initial/Assessment Note    Patient Details  Name: Daniel Solis MRN: [id number] Date of Birth: 01/17/1976  Transition of Care [facility]) CM/SW Contact:    Lennart Pall, LCSW Phone Number: 07/08/2019, 2:03 PM  Clinical Narrative:      Met with pt to review d/c needs and discuss SA hx/ possible resources.  Pt appears generally frustrated with his situation.  States, "I feel useless.  Look at me."  Says that he is still trying to figure out his d/c plan noting that [place] still talking with wife.  He is agreeable with this SW ordering needed DME but will hold on [facility] until confirmed d/c location.  Regarding SA issues, pt describes an increasing ETOH use citing ongoing financial stains and his wife's own health issues.  He notes he has been involved in many SA treatment programs over the past 15 yrs - both inpatient, outpatient and [place] meetings.  He is interested in reconnecting with an IOP once he is home and his mobility will allow.  He was having a little difficulty keeping focus as we discussed this.  Pt c/o pain and agreed that I will follow up with him in the morning to confirm d/c location and discuss SA programs further.         Expected Discharge Plan: [place] Barriers to Discharge: Continued Medical Work up   Patient Goals and CMS Choice Patient states their goals for this hospitalization and ongoing recovery are:: "I don't know.  I'm in bad shape." CMS Medicare.gov Compare Post Acute Care list provided to:: Patient Choice offered to / list presented to : Patient  Expected Discharge Plan and Services Expected Discharge Plan: [place] In-house Referral: Clinical Social Work   Post Acute Care Choice: Durable Medical Equipment, Home Health Living arrangements for the past 2 months: [place]                 DME Arranged: 3-N-1, Walker rolling, Youth worker  wheelchair with seat cushion DME Agency: [employer] Date DME Agency Contacted: 07/08/19 Time DME Agency Contacted: 50 Representative spoke with at DME Agency: Andree Coss            Prior Living Arrangements/Services Living arrangements for the past 2 months: [place] with:: Spouse, [place] Children Patient language and need for interpreter reviewed:: Yes Do you feel safe going back to the place where you live?: Yes      Need for Family Participation in Patient Care: Yes (Comment) Care giver support system in place?: Yes (comment)   Criminal Activity/Legal Involvement Pertinent to Current Situation/Hospitalization: No - Comment as needed  Activities of Daily Living Home Assistive Devices/Equipment: None ADL Screening (condition at time of admission) Patient's cognitive ability adequate to safely complete daily activities?: Yes Is the patient deaf or have difficulty hearing?: No Does the patient have difficulty seeing, even when wearing glasses/contacts?: No Does the patient have difficulty concentrating, remembering, or making decisions?: No Patient able to express need for assistance with ADLs?: Yes Does the patient have difficulty dressing or bathing?: No Independently performs ADLs?: Yes (appropriate for developmental age) Does the patient have difficulty walking or climbing stairs?: No Weakness of Legs: None Weakness of Arms/Hands: None  Permission Sought/Granted                  Emotional Assessment Appearance:: Appears stated age Attitude/Demeanor/Rapport:  Guarded Affect (typically observed): Restless, Frustrated, Overwhelmed Orientation: : Oriented to Self, Oriented to Place, Oriented to  Time, Oriented to Situation Alcohol / Substance Use: Alcohol Use Psych Involvement: No (comment)  Admission diagnosis:  Closed fracture of right tibia and fibula [S82.201A, S82.401A] Tibia/fibula fracture, right, closed, initial encounter [S82.201A,  S82.401A] Patient Active Problem List   Diagnosis Date Noted  . Closed fracture of right tibia and fibula 07/04/2019  . Closed fracture of right fibula and tibia   . Paresthesia 02/04/2016  . Smoker 02/13/2011  . Left hemiparesis ([place]) 02/13/2011  . Hemiplegia and hemiparesis 02/11/2011   PCP:  Patient, No Pcp Per Pharmacy:   [place], [place] [address] [place] [address] Phone: [phone] Fax: [place], [place] [address] Phone: [phone] Fax: [phone]     Social Determinants of Health (SDOH) Interventions    Readmission Risk Interventions No flowsheet data found.

## 2019-07-08 NOTE — Progress Notes (Signed)
Physical Therapy Treatment Patient Details Name: Daniel Solis MRN: [id number] DOB: December 27, 1975 Today's Date: 07/08/2019    History of Present Illness Pt s/p R tib/fib fx and now s/p IM nailing.  Pt with hx of TIA, ADD, EtOH. Pt now going through EtOH withdrawal.    PT Comments    POD # 3 R LE present with increased swelling.  Instructed pt to elevate "even more" and perform AP and knee presses as much as he can tolerate.  Assisted OOB to amb a functional distance.  General bed mobility comments: pt uses a belt loop to self assist LE off/onto bed.  General Gait Details: Pt aware he is NWB.  Applied L shoe to allow more clearance for R and increase his stability.  Tolerated an increased distance.  Increased pain with R LE down in a dependent position. Assisted back to bed per pt request "the bed is more comfortable" and assisted with elevating leg "even more".  Applied ICE to knee area "that's where it hurts the most".   Follow Up Recommendations  Home health PT     Equipment Recommendations  3in1 (PT);Rolling walker with 5" wheels;Wheelchair (measurements PT);Wheelchair cushion (measurements PT)    Recommendations for Other Services       Precautions / Restrictions Precautions Precautions: Fall Restrictions Weight Bearing Restrictions: Yes Other Position/Activity Restrictions: Elevate R LE    Mobility  Bed Mobility Overal bed mobility: Needs Assistance Bed Mobility: Supine to Sit;Sit to Supine     Supine to sit: Supervision;Min guard Sit to supine: Supervision;Min guard   General bed mobility comments: pt uses a belt loop to self assist LE off/onto bed  Transfers Overall transfer level: Needs assistance Equipment used: Rolling walker (2 wheeled) Transfers: Sit to/from Stand Sit to Stand: Supervision;Min guard         General transfer comment: VCs hand placement  Ambulation/Gait Ambulation/Gait assistance: Supervision;Min guard Gait Distance (Feet): 28  Feet Assistive device: Rolling walker (2 wheeled) Gait Pattern/deviations: Step-to pattern;Decreased stride length Gait velocity: decreased   General Gait Details: Pt aware he is NWB.  Applied L shoe to allow more clearance for R and increase his stability.  Tolerated an increased distance.  Increased pain with R LE down in a dependent position.   Stairs             Wheelchair Mobility    Modified Rankin (Stroke Patients Only)       Balance                                            Cognition Arousal/Alertness: Awake/alert Behavior During Therapy: WFL for tasks assessed/performed Overall Cognitive Status: Within Functional Limits for tasks assessed                                 General Comments: pleasant, cooperative      Exercises      General Comments        Pertinent Vitals/Pain Pain Assessment: Faces Faces Pain Scale: Hurts whole lot Pain Location: R knee > R distal LE Pain Descriptors / Indicators: Sharp;Grimacing;Discomfort;Throbbing;Tightness Pain Intervention(s): Monitored during session;Repositioned;Ice applied    Home Living                      Prior Function  PT Goals (current goals can now be found in the care plan section) Progress towards PT goals: Progressing toward goals    Frequency    Min 6X/week      PT Plan Current plan remains appropriate    Co-evaluation              AM-PAC PT "6 Clicks" Mobility   Outcome Measure  Help needed turning from your back to your side while in a flat bed without using bedrails?: A Little Help needed moving from lying on your back to sitting on the side of a flat bed without using bedrails?: A Little Help needed moving to and from a bed to a chair (including a wheelchair)?: A Little Help needed standing up from a chair using your arms (e.g., wheelchair or bedside chair)?: A Little Help needed to walk in hospital room?: A  Little Help needed climbing 3-5 steps with a railing? : A Lot 6 Click Score: 17    End of Session Equipment Utilized During Treatment: Gait belt Activity Tolerance: Patient limited by pain Patient left: with call bell/phone within reach;in chair;with chair alarm set Nurse Communication: Mobility status PT Visit Diagnosis: Difficulty in walking, not elsewhere classified (R26.2);Pain Pain - Right/Left: Right Pain - part of body: Leg     Time: 1150-1209 PT Time Calculation (min) (ACUTE ONLY): 19 min  Charges:    1 gt                    Rica Koyanagi  PTA Acute  Rehabilitation Services Pager      [phone] Office      [phone]

## 2019-07-08 NOTE — Progress Notes (Signed)
PROGRESS NOTE    Daniel Solis  IRC:[id number] DOB: 1976/03/18 DOA: 07/04/2019 PCP: Patient, No Pcp Per   Brief Narrative: Patient is a 44 year old male with history of chronic alcohol abuse, hepatic steatosis, tobacco abuse, ADHD who initially presented with mechanical fall resulting in distal right lower extremity fracture, status post surgery.  Orthopedics consulted [place] for the management of alcohol withdrawal.  Assessment & Plan:   Principal Problem:   Closed fracture of right fibula and tibia Active Problems:   Closed fracture of right tibia and fibula   Alcohol withdrawal/chronic alcohol abuse: Currently hemodynamically stable.  Monitor CIWA, continue protocol.  Started on thiamine, folic acid, multivitamin.  Continue gabapentin for withdrawal.  Xanax started.  Counseled for alcohol cessation. He is on Xanax at home which we recommend to discontinue because of concurrent alcohol abuse. Social worker consulted for rehabilitation resources.  Hypertension: Currently blood pressure stable.  Continue metoprolol which he takes at home.  Tobacco use disorder: Currently smokes half pack a day.  Continue nicotine patch.  Counseled for cessation  Right tibia/fibula fracture: Status post intramedullary nail placement.  Continue pain management, supportive care.  Management as per orthopedics.  Plan for discharge to home with home health. On DVT prophylaxis with aspirin 81 mg twice a day.  History of hepatitis C: Continue management as an outpatient.  ADHD: On Adderall.  Patient is hemodynamically stable from medical perspective for discharge.  Continue gabapentin for withdrawal/craving  on discharge.  Medicine will sign off.  Please call as needed.         DVT prophylaxis:Aspirin Code Status: Full    Procedures: Intramedullary nailing.  Antimicrobials:  Anti-infectives (From admission, onward)   Start     Dose/Rate Route Frequency Ordered Stop   07/05/19 1330  ceFAZolin  (ANCEF) IVPB 1 g/50 mL premix     1 g 100 mL/hr over 30 Minutes Intravenous Every 6 hours 07/05/19 1103 07/06/19 0215   07/05/19 0655  ceFAZolin (ANCEF) 2-4 GM/100ML-% IVPB    Note to Pharmacy: Quinn Axe   : cabinet override      07/05/19 0655 07/05/19 1859      Subjective:  Patient seen and examined at the bedside this morning.  Hemodynamically stable.  Complains of pain on the surgical site.  Denies any other complaints.  No signs of alcohol withdrawal.  Objective: Vitals:   07/07/19 0447 07/07/19 1505 07/07/19 2051 07/08/19 0148  BP: 131/87 115/80 123/80 130/80  Pulse: 61 62 68 (!) 56  Resp: 15 18 14 16   Temp: 98 F (36.7 C) 97.7 F (36.5 C) 98.2 F (36.8 C) 98 F (36.7 C)  TempSrc:   Oral Oral  SpO2: 100% 97% 97% 97%  Weight:      Height:        Intake/Output Summary (Last 24 hours) at 07/08/2019 0801 Last data filed at 07/08/2019 0034 Gross per 24 hour  Intake --  Output 2475 ml  Net -2475 ml   Filed Weights   07/04/19 2118 07/04/19 2315  Weight: 113.4 kg 113.4 kg    Examination:  General exam: Appears calm and comfortable ,Not in distress,average built Respiratory system: Bilateral equal air entry, normal vesicular breath sounds, no wheezes or crackles  Cardiovascular system: S1 & S2 heard, RRR. No JVD, murmurs, rubs, gallops or clicks. No pedal edema. Gastrointestinal system: Abdomen is nondistended, soft and nontender. No organomegaly or masses felt. Normal bowel sounds heard. Central nervous system: Alert and oriented. No focal neurological deficits. Extremities: No edema,  no clubbing ,no cyanosis, clean surgical wounds on the right lower extremity  skin: No rashes, lesions or ulcers,no icterus ,no pallor     Data Reviewed: I have personally reviewed following labs and imaging studies  CBC: Recent Labs  Lab 07/04/19 2102  WBC 8.1  NEUTROABS 4.5  HGB 16.0  HCT 48.6  MCV 91.5  PLT 101   Basic Metabolic Panel: Recent Labs  Lab  07/04/19 2102 07/07/19 1312  NA 142  --   K 3.7  --   CL 106  --   CO2 26  --   GLUCOSE 88  --   BUN 20  --   CREATININE 0.95  --   CALCIUM 8.5*  --   PHOS  --  2.9   GFR: Estimated Creatinine Clearance: 126.5 mL/min (by C-G formula based on SCr of 0.95 mg/dL). Liver Function Tests: No results for input(s): AST, ALT, ALKPHOS, BILITOT, PROT, ALBUMIN in the last 168 hours. No results for input(s): LIPASE, AMYLASE in the last 168 hours. No results for input(s): AMMONIA in the last 168 hours. Coagulation Profile: No results for input(s): INR, PROTIME in the last 168 hours. Cardiac Enzymes: No results for input(s): CKTOTAL, CKMB, CKMBINDEX, TROPONINI in the last 168 hours. BNP (last 3 results) No results for input(s): PROBNP in the last 8760 hours. HbA1C: No results for input(s): HGBA1C in the last 72 hours. CBG: No results for input(s): GLUCAP in the last 168 hours. Lipid Profile: No results for input(s): CHOL, HDL, LDLCALC, TRIG, CHOLHDL, LDLDIRECT in the last 72 hours. Thyroid Function Tests: No results for input(s): TSH, T4TOTAL, FREET4, T3FREE, THYROIDAB in the last 72 hours. Anemia Panel: No results for input(s): VITAMINB12, FOLATE, FERRITIN, TIBC, IRON, RETICCTPCT in the last 72 hours. Sepsis Labs: No results for input(s): PROCALCITON, LATICACIDVEN in the last 168 hours.  Recent Results (from the past 240 hour(s))  Respiratory Panel by RT PCR (Flu A&B, Covid) - Nasopharyngeal Swab     Status: None   Collection Time: 07/04/19  9:10 PM   Specimen: Nasopharyngeal Swab  Result Value Ref Range Status   SARS Coronavirus 2 by RT PCR NEGATIVE NEGATIVE Final    Comment: (NOTE) SARS-CoV-2 target nucleic acids are NOT DETECTED. The SARS-CoV-2 RNA is generally detectable in upper respiratoy specimens during the acute phase of infection. The lowest concentration of SARS-CoV-2 viral copies this assay can detect is 131 copies/mL. A negative result does not preclude  SARS-Cov-2 infection and should not be used as the sole basis for treatment or other patient management decisions. A negative result may occur with  improper specimen collection/handling, submission of specimen other than nasopharyngeal swab, presence of viral mutation(s) within the areas targeted by this assay, and inadequate number of viral copies (<131 copies/mL). A negative result must be combined with clinical observations, patient history, and epidemiological information. The expected result is Negative. Fact Sheet for Patients:  PinkCheek.be Fact Sheet for Healthcare Providers:  GravelBags.it This test is not yet ap proved or cleared by the [place] FDA and  has been authorized for detection and/or diagnosis of SARS-CoV-2 by FDA under an Emergency Use Authorization (EUA). This EUA will remain  in effect (meaning this test can be used) for the duration of the COVID-19 declaration under Section 564(b)(1) of the Act, 21 U.S.C. section 360bbb-3(b)(1), unless the authorization is terminated or revoked sooner.    Influenza A by PCR NEGATIVE NEGATIVE Final   Influenza B by PCR NEGATIVE NEGATIVE Final    Comment: (  NOTE) The Xpert Xpress SARS-CoV-2/FLU/RSV assay is intended as an aid in  the diagnosis of influenza from Nasopharyngeal swab specimens and  should not be used as a sole basis for treatment. Nasal washings and  aspirates are unacceptable for Xpert Xpress SARS-CoV-2/FLU/RSV  testing. Fact Sheet for Patients: https://www.moore.com/ Fact Sheet for Healthcare Providers: https://www.young.biz/ This test is not yet approved or cleared by the [place] FDA and  has been authorized for detection and/or diagnosis of SARS-CoV-2 by  FDA under an Emergency Use Authorization (EUA). This EUA will remain  in effect (meaning this test can be used) for the duration of the  Covid-19  declaration under Section 564(b)(1) of the Act, 21  U.S.C. section 360bbb-3(b)(1), unless the authorization is  terminated or revoked. Performed at [facility], [address] [place], [place] [address]   Surgical PCR screen     Status: None   Collection Time: 07/05/19 12:45 AM   Specimen: Nasal Mucosa; Nasal Swab  Result Value Ref Range Status   MRSA, PCR NEGATIVE NEGATIVE Final   Staphylococcus aureus NEGATIVE NEGATIVE Final    Comment: (NOTE) The Xpert SA Assay (FDA approved for NASAL specimens in patients 90 years of age and older), is one component of a comprehensive surveillance program. It is not intended to diagnose infection nor to guide or monitor treatment. Performed at [facility], [address], [place], [place] [address]          Radiology Studies: No results found.      Scheduled Meds: . amphetamine-dextroamphetamine  30 mg Oral BID WC  . aspirin  81 mg Oral BID PC  . docusate sodium  100 mg Oral BID  . folic acid  1 mg Oral Daily  . gabapentin  100 mg Oral TID  . metoprolol tartrate  50 mg Oral BID  . multivitamin with minerals  1 tablet Oral Daily  . nicotine  14 mg Transdermal Daily  . thiamine  100 mg Oral Daily   Or  . thiamine  100 mg Intravenous Daily   Continuous Infusions: . sodium chloride 75 mL/hr at 07/07/19 0439  . sodium chloride 75 mL/hr at 07/07/19 0200  . methocarbamol (ROBAXIN) IV    . methocarbamol (ROBAXIN) IV Stopped (07/05/19 1022)     LOS: 4 days    Time spent: More than 50% of that time was spent in counseling and/or coordination of care.      Burnadette Pop, MD [facility] P4/13/2021, 8:01 AM

## 2019-07-09 ENCOUNTER — Ambulatory Visit: Payer: Medicaid Other | Admitting: Physical Therapy

## 2019-07-09 NOTE — TOC Progression Note (Signed)
Transition of Care [facility]) - Progression Note    Patient Details  Name: Daniel Solis MRN: [id number] Date of Birth: Feb 20, 1976  [facility] CM/SW Contact  Amada Jupiter, LCSW Phone Number: 07/09/2019, 2:12 PM  Clinical Narrative:   Have discussed d/c needs again with pt and phone call to wife, Daniel Solis.  Pt brighter today and talks about his intention to restart with AA meetings (I will provide current meeting list).  He is very familiar with other programs available and welcomes written info from me but plans to start with AA return.  Wife is aware of this and is very supportive.  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   DME ordered and in his room.  Still working to secure HHPT.    Expected Discharge Plan: Home w Home Health Services Barriers to Discharge: Continued Medical Work up  Expected Discharge Plan and Services Expected Discharge Plan: Home w Home Health Services In-house Referral: Clinical Social Work   Post Acute Care Choice: Durable Medical Equipment, Home Health Living arrangements for the past 2 months: Single Family Home                 DME Arranged: 3-N-1, Walker rolling, Community education officer wheelchair with seat cushion DME Agency: [employer] Date DME Agency Contacted: 07/08/19 Time DME Agency Contacted: 1400 Representative spoke with at DME Agency: Oletha Cruel             Social Determinants of Health (SDOH) Interventions    Readmission Risk Interventions No flowsheet data found.

## 2019-07-09 NOTE — Progress Notes (Signed)
Patient ID: Daniel Solis, male   DOB: 1975-09-11, 44 y.o.   MRN: [id number] There has been no acute change in the patient's medical status. His vital signs are stable. I did change the dressings on his right leg and all incisions look good. Thus far there is no signs of alcohol withdrawal. We appreciate Dr. Dionisio David assistance in the care for him. Social Work is also assisting in the patient's discharge planning. Hopefully he will have a good day of physical therapy today in anticipation of being discharged to home setting tomorrow.

## 2019-07-09 NOTE — Plan of Care (Signed)

## 2019-07-10 ENCOUNTER — Other Ambulatory Visit (INDEPENDENT_AMBULATORY_CARE_PROVIDER_SITE_OTHER): Payer: Self-pay | Admitting: Physician Assistant

## 2019-07-10 MED ORDER — METHOCARBAMOL 500 MG PO TABS: 500.0000 mg | ORAL_TABLET | Freq: Four times a day (QID) | ORAL | 1 refills | Status: DC | PRN

## 2019-07-10 MED ORDER — OXYCODONE HCL 5 MG PO TABS: 5.0000 mg | ORAL_TABLET | ORAL | 0 refills | Status: DC | PRN

## 2019-07-10 MED ORDER — FOLIC ACID 1 MG PO TABS: 1.0000 mg | ORAL_TABLET | Freq: Every day | ORAL | 0 refills | Status: DC

## 2019-07-10 MED ORDER — GABAPENTIN 100 MG PO CAPS: 100.0000 mg | ORAL_CAPSULE | Freq: Three times a day (TID) | ORAL | 0 refills | Status: DC

## 2019-07-10 MED ORDER — ASPIRIN 81 MG PO CHEW: 81.0000 mg | CHEWABLE_TABLET | Freq: Two times a day (BID) | ORAL | 0 refills | Status: DC

## 2019-07-10 NOTE — TOC Transition Note (Signed)
Transition of Care [facility]) - CM/SW Discharge Note   Patient Details  Name: Danta Baumgardner MRN: [id number] Date of Birth: 1975-09-24  Transition of Care [facility]) CM/SW Contact:  Amada Jupiter, LCSW Phone Number: 07/10/2019, 11:59 AM   Clinical Narrative:   Pt for d/c this morning. Finally able to secure [facility] agency - [facility] - to provide HHPT.  Also, provided pt with SA resources including current AA meeting list. No further needs.    Final next level of care: Home w Home Health Services Barriers to Discharge: Barriers Resolved   Patient Goals and CMS Choice Patient states their goals for this hospitalization and ongoing recovery are:: "I don't know.  I'm in bad shape." CMS Medicare.gov Compare Post Acute Care list provided to:: Patient Choice offered to / list presented to : Patient  Discharge Placement                       Discharge Plan and Services In-house Referral: Clinical Social Work   Post Acute Care Choice: Durable Medical Equipment, Home Health          DME Arranged: 3-N-1, Walker rolling, Community education officer wheelchair with seat cushion DME Agency: [employer] Date DME Agency Contacted: 07/08/19 Time DME Agency Contacted: 1400 Representative spoke with at DME Agency: Oletha Cruel HH Arranged: PT HH Agency: [facility] Date HH Agency Contacted: 07/10/19 Time HH Agency Contacted: [phone]    Social Determinants of Health (SDOH) Interventions     Readmission Risk Interventions Readmission Risk Prevention Plan 07/10/2019  Post Dischage Appt Complete  Medication Screening Complete  Transportation Screening Complete  Some recent data might be hidden

## 2019-07-10 NOTE — Progress Notes (Signed)
Subjective: 5 Days Post-Op Procedure(s) (LRB): INTRAMEDULLARY (IM) NAIL TIBIAL (Right) Patient reports pain as moderate.  No complaints.   Objective: Vital signs in last 24 hours: Temp:  [98 F (36.7 C)-98.8 F (37.1 C)] 98.8 F (37.1 C) (04/15 0523) Pulse Rate:  [70-82] 72 (04/15 0820) Resp:  [16-19] 19 (04/15 0523) BP: (110-132)/(64-90) 118/71 (04/15 0523) SpO2:  [97 %-98 %] 97 % (04/15 0523)  Intake/Output from previous day: 04/14 0701 - 04/15 0700 In: 680 [P.O.:680] Out: 1450 [Urine:1450] Intake/Output this shift: No intake/output data recorded.  No results for input(s): HGB in the last 72 hours. No results for input(s): WBC, RBC, HCT, PLT in the last 72 hours. No results for input(s): NA, K, CL, CO2, BUN, CREATININE, GLUCOSE, CALCIUM in the last 72 hours. No results for input(s): LABPT, INR in the last 72 hours.  Right lower extremity: Intact pulses distally Dorsiflexion/Plantar flexion intact Incision: dressing C/D/I Compartment soft   Assessment/Plan: 5 Days Post-Op Procedure(s) (LRB): INTRAMEDULLARY (IM) NAIL TIBIAL (Right) Up with therapy  Discharge to home     [facility] 07/10/2019, 8:25 AM

## 2019-07-10 NOTE — Progress Notes (Signed)
Physical Therapy Treatment Patient Details Name: Daniel Solis MRN: [id number] DOB: 21-Jun-1975 Today's Date: 07/10/2019    History of Present Illness Pt s/p R tib/fib fx and now s/p IM nailing.  Pt with hx of TIA, ADD, EtOH. Pt now going through EtOH withdrawal.    PT Comments    POD #5 R LE appears decreased edema.  "I've been doing really good about keeping it elevated" and pt performing exercises that were shown to him.  Assisted OOB pt was self able and doing well using his leg strap.  Practiced one step.  General stair comments: 50% VC's on proper tech how to "jump up" backward one step using walker.  Also instructed that family can park wheelchair on top of step so such the he can turn and sit and not even need to "jump up" to step.  Wheelchair was delivered.  Adjusted leg rests to fullest extension.  Performed   R TE's in bother supine and EOB.  Then returned to bed and elevated R LE.  Addressed all mobility questions, discussed appropriate activity, educated on use of ICE.  Pt ready for D/C to home.   Follow Up Recommendations  Home health PT     Equipment Recommendations  3in1 (PT);Rolling walker with 5" wheels;Wheelchair (measurements PT);Wheelchair cushion (measurements PT)    Recommendations for Other Services       Precautions / Restrictions Precautions Precautions: Fall Restrictions Weight Bearing Restrictions: Yes RLE Weight Bearing: Non weight bearing Other Position/Activity Restrictions: Elevate R LE    Mobility  Bed Mobility Overal bed mobility: Needs Assistance Bed Mobility: Supine to Sit;Sit to Supine     Supine to sit: Modified independent (Device/Increase time) Sit to supine: Modified independent (Device/Increase time)   General bed mobility comments: pt uses a belt loop to self assist LE off/onto bed  Transfers Overall transfer level: Needs assistance Equipment used: Rolling walker (2 wheeled) Transfers: Sit to/from Stand Sit to Stand: Modified  independent (Device/Increase time);Supervision         General transfer comment: good safety cognition  Ambulation/Gait Ambulation/Gait assistance: Modified independent (Device/Increase time) Gait Distance (Feet): 5 Feet Assistive device: Rolling walker (2 wheeled) Gait Pattern/deviations: Step-to pattern;Decreased stride length Gait velocity: decreased   General Gait Details: Pt aware he is NWB.  Applied L shoe to allow more clearance for R and increase his stability.  Only amn in room from bed to one step and back.   Stairs Stairs: Yes Stairs assistance: Supervision;Min guard Stair Management: No rails;Step to pattern;Backwards Number of Stairs: 1 General stair comments: 50% VC's on proper tech how to "jump up" backward one step using walker   Wheelchair Mobility    Modified Rankin (Stroke Patients Only)       Balance                                            Cognition Arousal/Alertness: Awake/alert Behavior During Therapy: WFL for tasks assessed/performed Overall Cognitive Status: Within Functional Limits for tasks assessed                                 General Comments: pleasant, cooperative, eager to get home      Exercises   10 reps AP, knee presses, HA, ABd, SLR in supine 10 reps knee bends and LAQ's in seated  General Comments        Pertinent Vitals/Pain Pain Assessment: 0-10 Pain Score: 7  Pain Location: R knee > R distal LE Pain Descriptors / Indicators: Sharp;Grimacing;Discomfort;Throbbing;Tightness Pain Intervention(s): Monitored during session;Premedicated before session;Repositioned    Home Living                      Prior Function            PT Goals (current goals can now be found in the care plan section) Progress towards PT goals: Progressing toward goals    Frequency    Min 6X/week      PT Plan Current plan remains appropriate    Co-evaluation              AM-PAC  PT "6 Clicks" Mobility   Outcome Measure  Help needed turning from your back to your side while in a flat bed without using bedrails?: None Help needed moving from lying on your back to sitting on the side of a flat bed without using bedrails?: None Help needed moving to and from a bed to a chair (including a wheelchair)?: None Help needed standing up from a chair using your arms (e.g., wheelchair or bedside chair)?: None Help needed to walk in hospital room?: None Help needed climbing 3-5 steps with a railing? : A Little 6 Click Score: 23    End of Session Equipment Utilized During Treatment: Gait belt Activity Tolerance: Patient tolerated treatment well Patient left: in bed;with call bell/phone within reach Nurse Communication: Mobility status PT Visit Diagnosis: Difficulty in walking, not elsewhere classified (R26.2);Pain Pain - Right/Left: Right Pain - part of body: Leg     Time: 1037-1100 PT Time Calculation (min) (ACUTE ONLY): 23 min  Charges:  $Gait Training: 8-22 mins $Therapeutic Exercise: 8-22 mins                     Rica Koyanagi  PTA Acute  Rehabilitation Services Pager      [phone] Office      [phone]

## 2019-07-10 NOTE — Plan of Care (Signed)
Pt is discharged to home with all the belongings. All questions were answered.

## 2019-07-10 NOTE — Discharge Summary (Signed)
Patient ID: Daniel Solis MRN: [id number] DOB/AGE: Jun 28, 1975 44 y.o.  Admit date: 07/04/2019 Discharge date: 07/10/2019  Admission Diagnoses:  Principal Problem:   Closed fracture of right fibula and tibia Active Problems:   Closed fracture of right tibia and fibula   Discharge Diagnoses:  Alcohol and tobacco abuse / withdrawal  Closed right tibia fracture Im nailing  Past Medical History:  Diagnosis Date  . Anxiety   . Attention deficit disorder   . Cigarette smoker   . Hepatitis C   . Hypertension   . TIA (transient ischemic attack)     Surgeries: Procedure(s): INTRAMEDULLARY (IM) NAIL TIBIAL on 07/05/2019   Consultants: Treatment Team:  Kathryne Hitch, MD  Discharged Condition: Improved  Hospital Course: Daniel Solis is an 44 y.o. male who was admitted 07/04/2019 for operative treatment ofClosed fracture of right fibula and tibia. Patient has severe unremitting pain that affects sleep, daily activities, and work/hobbies. After pre-op clearance the patient was taken to the operating room on 07/05/2019 and underwent Patient seen and treated by hospitalitis for alcohol and tobacco abuse/ withdrawal.    Procedure(s): INTRAMEDULLARY (IM) NAIL TIBIAL.    Patient was given perioperative antibiotics:  Anti-infectives (From admission, onward)   Start     Dose/Rate Route Frequency Ordered Stop   07/05/19 1330  ceFAZolin (ANCEF) IVPB 1 g/50 mL premix     1 g 100 mL/hr over 30 Minutes Intravenous Every 6 hours 07/05/19 1103 07/06/19 0215   07/05/19 0655  ceFAZolin (ANCEF) 2-4 GM/100ML-% IVPB    Note to Pharmacy: Quinn Axe   : cabinet override      07/05/19 0655 07/05/19 1859       Patient was given sequential compression devices, early ambulation, and chemoprophylaxis to prevent DVT.  Patient benefited maximally from hospital stay and there were no complications.    Recent vital signs:  Patient Vitals for the past 24 hrs:  BP Temp Temp src Pulse Resp SpO2   07/10/19 0523 118/71 98.8 F (37.1 C) Oral 70 19 97 %  07/09/19 2109 110/78 98.6 F (37 C) Oral 79 17 97 %  07/09/19 1822 113/90 98.6 F (37 C) Oral 80 18 98 %  07/09/19 1153 132/64 98 F (36.7 C) -- 82 16 97 %  07/09/19 1106 114/72 -- -- 82 -- 98 %     Recent laboratory studies: No results for input(s): WBC, HGB, HCT, PLT, NA, K, CL, CO2, BUN, CREATININE, GLUCOSE, INR, CALCIUM in the last 72 hours.  Invalid input(s): PT, 2   Discharge Medications:   Allergies as of 07/10/2019   No Known Allergies     Medication List    STOP taking these medications   BC HEADACHE POWDER PO   cyclobenzaprine 10 MG tablet Commonly known as: FLEXERIL     TAKE these medications   Adderall XR 30 MG 24 hr capsule Generic drug: amphetamine-dextroamphetamine Take 30 mg by mouth 2 (two) times daily.   ALPRAZolam 1 MG tablet Commonly known as: XANAX Take 1 mg by mouth 2 (two) times daily.   Anoro Ellipta 62.5-25 MCG/INH Aepb Generic drug: umeclidinium-vilanterol Inhale 1 puff into the lungs daily.   aspirin 81 MG chewable tablet Chew 1 tablet (81 mg total) by mouth 2 (two) times daily after a meal.   Diclofenac Sodium CR 100 MG 24 hr tablet Take 1 tablet (100 mg total) by mouth daily.   folic acid 1 MG tablet Commonly known as: FOLVITE Take 1 tablet (1 mg total) by  mouth daily.   gabapentin 100 MG capsule Commonly known as: NEURONTIN Take 1 capsule (100 mg total) by mouth 3 (three) times daily.   lidocaine 5 % Commonly known as: Lidoderm Place 1 patch onto the skin daily. Remove & Discard patch within 12 hours or as directed by MD   methocarbamol 500 MG tablet Commonly known as: ROBAXIN Take 1 tablet (500 mg total) by mouth 2 (two) times daily. What changed: Another medication with the same name was added. Make sure you understand how and when to take each.   methocarbamol 500 MG tablet Commonly known as: ROBAXIN Take 1 tablet (500 mg total) by mouth every 6 (six) hours as  needed for muscle spasms. What changed: You were already taking a medication with the same name, and this prescription was added. Make sure you understand how and when to take each.   metoprolol tartrate 50 MG tablet Commonly known as: LOPRESSOR Take 50 mg by mouth 2 (two) times daily.   multivitamin with minerals Tabs tablet Take 1 tablet by mouth daily.   nicotine 21 mg/24hr patch Commonly known as: NICODERM CQ - dosed in mg/24 hours Place 21 mg onto the skin daily.   oxyCODONE 5 MG immediate release tablet Commonly known as: Oxy IR/ROXICODONE Take 1-2 tablets (5-10 mg total) by mouth every 4 (four) hours as needed for moderate pain (pain score 4-6).   ProAir HFA 108 (90 Base) MCG/ACT inhaler Generic drug: albuterol Inhale 2 puffs into the lungs every 4 (four) hours as needed.   Testosterone 1.62 % Gel Apply 1 Pump topically daily. To shoulder            Durable Medical Equipment  (From admission, onward)         Start     Ordered   07/07/19 1855  For home use only DME lightweight manual wheelchair with seat cushion  Once    Comments: Patient suffers from a right tibia/fibula fracture which impairs their ability to perform daily activities like bathing, grooming and toileting in the home.  A walker will not resolve  issue with performing activities of daily living. A wheelchair will allow patient to safely perform daily activities. Patient is not able to propel themselves in the home using a standard weight wheelchair due to endurance and general weakness. Patient can self propel in the lightweight wheelchair. Length of need 6 months . Accessories: elevating leg rests (ELRs), wheel locks, extensions and anti-tippers.   07/07/19 1854   07/05/19 1104  DME Walker rolling  Once    Question Answer Comment  Walker: With 5 Inch Wheels   Patient needs a walker to treat with the following condition Closed fracture of right tibia and fibula      07/05/19 1103   07/05/19 1104   DME 3 n 1  Once     07/05/19 1103          Diagnostic Studies: DG Tibia/Fibula Right  Result Date: 07/05/2019 CLINICAL DATA:  Right tibial ORIF. FLUOROSCOPY TIME:  2 minutes and 19 seconds. EXAM: RIGHT TIBIA AND FIBULA - 2 VIEW COMPARISON:  None. FINDINGS: An intramedullary rod has been placed in the tibia with proximal and distal interlocking nails. The distal fibular fracture has been reduced. No other acute abnormalities. IMPRESSION: ORIF of right tibial fractures. Reduction of right fibular fracture. Electronically Signed   By: Gerome Sam III M.D   On: 07/05/2019 12:31   DG Tibia/Fibula Right  Result Date: 07/04/2019 CLINICAL DATA:  Status  post trauma. EXAM: RIGHT TIBIA AND FIBULA - 2 VIEW COMPARISON:  None. FINDINGS: Acute fracture deformities are seen involving the shafts of the mid to distal right tibia and right fibula, at the junction of the middle and distal 1/3. There is moderate to marked severity anterior angulation of the fracture apices. Additional nondisplaced fractures are seen extending through the proximal aspects of the right tibial and right fibula. No dislocation is identified. Soft tissue swelling is seen surrounding the previously described distal fracture sites. IMPRESSION: Acute fractures of proximal and mid to distal portions of the right tibia and right fibula. Electronically Signed   By: Virgina Norfolk M.D.   On: 07/04/2019 20:31   DG C-Arm 1-60 Min-No Report  Result Date: 07/05/2019 Fluoroscopy was utilized by the requesting physician.  No radiographic interpretation.    Disposition:    Needs follow up with primary care for alcohol and tobacco cessation    Follow-up Information    Mcarthur Rossetti, MD. Schedule an appointment as soon as possible for a visit in 2 week(s).   Specialty: Orthopedic Surgery Contact information: [address] [place] [address] [phone]            Signed: Erskine Emery 07/10/2019, 8:11 AM

## 2019-07-10 NOTE — Discharge Instructions (Signed)
Do not put her full weight on your right lower extremity. You may touch your right leg to the ground just for balance only. You can get your dressings wet in the shower daily. Follow up with primary care physician for alcohol and tobacco cessation

## 2019-07-10 NOTE — Telephone Encounter (Signed)
Please advise 

## 2019-07-10 NOTE — Plan of Care (Signed)
  Problem: Education: Goal: Knowledge of General Education information will improve Description: Including pain rating scale, medication(s)/side effects and non-pharmacologic comfort measures Outcome: Adequate for Discharge   Problem: Health Behavior/Discharge Planning: Goal: Ability to manage health-related needs will improve Outcome: Adequate for Discharge   Problem: Clinical Measurements: Goal: Ability to maintain clinical measurements within normal limits will improve Outcome: Adequate for Discharge   Problem: Clinical Measurements: Goal: Will remain free from infection Outcome: Adequate for Discharge   Problem: Clinical Measurements: Goal: Diagnostic test results will improve Outcome: Adequate for Discharge

## 2019-07-11 ENCOUNTER — Telehealth: Payer: Self-pay | Admitting: Orthopaedic Surgery

## 2019-07-11 NOTE — Telephone Encounter (Signed)
Verbal order given  

## 2019-07-11 NOTE — Telephone Encounter (Signed)
Mary from [facility] called. She would like verbal orders for PT 1x wk 1 and 3x wk 3. She also would like to know if patient can take Tylenol and Advil. Her call back number is [phone]

## 2019-07-14 ENCOUNTER — Telehealth: Payer: Self-pay | Admitting: Orthopaedic Surgery

## 2019-07-14 ENCOUNTER — Other Ambulatory Visit: Payer: Self-pay | Admitting: Orthopaedic Surgery

## 2019-07-14 MED ORDER — OXYCODONE HCL 5 MG PO TABS: 5.0000 mg | ORAL_TABLET | ORAL | 0 refills | Status: DC | PRN

## 2019-07-14 NOTE — Telephone Encounter (Signed)
Please advise 

## 2019-07-14 NOTE — Telephone Encounter (Signed)
Patient called.   He needs a refill on his oxycodone.   Call back: [phone]

## 2019-07-14 NOTE — Telephone Encounter (Signed)
Patient called.   He needs a refill on his Oxycodone.   Call back: [phone]

## 2019-07-14 NOTE — Telephone Encounter (Signed)
I'll send in some. 

## 2019-07-16 ENCOUNTER — Ambulatory Visit: Payer: Medicaid Other | Admitting: Physical Therapy

## 2019-07-16 ENCOUNTER — Encounter: Payer: Self-pay | Admitting: *Deleted

## 2019-07-17 ENCOUNTER — Ambulatory Visit (INDEPENDENT_AMBULATORY_CARE_PROVIDER_SITE_OTHER): Payer: Medicaid Other | Admitting: Physician Assistant

## 2019-07-17 ENCOUNTER — Encounter: Payer: Self-pay | Admitting: Physician Assistant

## 2019-07-17 ENCOUNTER — Other Ambulatory Visit: Payer: Self-pay

## 2019-07-17 ENCOUNTER — Ambulatory Visit (INDEPENDENT_AMBULATORY_CARE_PROVIDER_SITE_OTHER): Payer: Medicaid Other

## 2019-07-17 DIAGNOSIS — S82201A Unspecified fracture of shaft of right tibia, initial encounter for closed fracture: Secondary | ICD-10-CM

## 2019-07-17 DIAGNOSIS — S82401A Unspecified fracture of shaft of right fibula, initial encounter for closed fracture: Secondary | ICD-10-CM | POA: Diagnosis not present

## 2019-07-17 MED ORDER — OXYCODONE HCL 5 MG PO TABS: 5.0000 mg | ORAL_TABLET | ORAL | 0 refills | Status: DC | PRN

## 2019-07-17 NOTE — Progress Notes (Signed)
HPI: [name] returns today 12 days status post IM nailing right tibia fracture.  The concern because it on 2 occasions he put full weight on the leg.  States his pain is worse at night.  He is getting around wheelchair.  Said no fevers chills.  Physical exam: Right knee full extension flexion to 90 degrees.  Incision sites are all healing well no signs of infection or dehiscence.  Right calf supple minimal tenderness over the mid to lower calf.  Dorsal flexion plantarflexion right ankle intact.  Radiographs: AP lateral views right tib-fib: Excellent overall alignment position of the proximal and distal tibia fractures.  Fibula fractures in overall good position alignment.  No hardware failure.  No other fractures identified.  Knee ankle joints are well located.  Impression: Status post IM nailing right tibia fracture for 07/05/19  Plan: Staples removed Steri-Strips applied.  He can get the incision wet.  50% weightbearing on the right leg.  Follow-up with [place] in 1 month AP and lateral views of the right tibia at that time.  Questions were encouraged and answered. Continue Aspirin 81 mg BID.

## 2019-07-18 ENCOUNTER — Telehealth: Payer: Self-pay | Admitting: Orthopaedic Surgery

## 2019-07-18 NOTE — Telephone Encounter (Signed)
Pt called stating he had some questions regarding the after math of his surgery, pt stated he would like a call from either [facility] or Dr. Magnus Ivan.  [phone]

## 2019-07-18 NOTE — Telephone Encounter (Signed)
Pt called reiterating that he would like a call back, I did let him know Bronson Curb and Dr. Magnus Ivan are in surgery and will return his call as soon as they are able, pt stated that he wouldn't mind talking to [facility] either.   [phone]

## 2019-07-18 NOTE — Telephone Encounter (Signed)
Called.

## 2019-07-21 ENCOUNTER — Telehealth: Payer: Self-pay | Admitting: Orthopaedic Surgery

## 2019-07-21 ENCOUNTER — Ambulatory Visit: Payer: Medicaid Other | Admitting: Physical Therapy

## 2019-07-21 MED ORDER — OXYCODONE HCL 5 MG PO TABS: 5.0000 mg | ORAL_TABLET | ORAL | 0 refills | Status: DC | PRN

## 2019-07-21 NOTE — Telephone Encounter (Signed)
FYI

## 2019-07-21 NOTE — Telephone Encounter (Signed)
Patient called. He would like a refill on oxycodone. His call back number is [phone]

## 2019-07-21 NOTE — Telephone Encounter (Signed)
I did send in some more today.

## 2019-07-21 NOTE — Telephone Encounter (Signed)
Please advise 

## 2019-07-21 NOTE — Telephone Encounter (Signed)
Corrie Dandy, with [facility] called to let Dr. Magnus Ivan know that the eval completed 07/11/19.  Medicaid authorization suspended until 07/23/19.  Had previous authorization for previous MCL tear which was never discharged, that is the reason for the delay.  CB#.[phone].  Thank you.

## 2019-07-22 ENCOUNTER — Telehealth: Payer: Self-pay | Admitting: Orthopaedic Surgery

## 2019-07-22 NOTE — Telephone Encounter (Signed)
Ok

## 2019-07-22 NOTE — Telephone Encounter (Signed)
Patient called.   He is requesting a note taking him out of work be mailed to him.   Call back number: [phone]

## 2019-07-22 NOTE — Telephone Encounter (Signed)
Yes that is fine

## 2019-07-23 NOTE — Telephone Encounter (Signed)
Mailed to patients home. 

## 2019-07-24 ENCOUNTER — Telehealth: Payer: Self-pay | Admitting: Orthopaedic Surgery

## 2019-07-24 ENCOUNTER — Other Ambulatory Visit: Payer: Self-pay | Admitting: Orthopaedic Surgery

## 2019-07-24 MED ORDER — OXYCODONE HCL 5 MG PO TABS: 5.0000 mg | ORAL_TABLET | ORAL | 0 refills | Status: DC | PRN

## 2019-07-24 NOTE — Telephone Encounter (Signed)
Please advise 

## 2019-07-24 NOTE — Telephone Encounter (Signed)
Patient called requesting a refill on Oxychodone. Please send to pharmacy on file. Patient phone number is [phone].

## 2019-07-24 NOTE — Telephone Encounter (Signed)
Notified patient.

## 2019-07-25 ENCOUNTER — Telehealth: Payer: Self-pay | Admitting: Radiology

## 2019-07-25 NOTE — Telephone Encounter (Signed)
He is already on everything that we can provide and prescribed for him.  He does have a history of alcohol abuse and that is certainly worrisome but there is really nothing else that we can prescribe.  He can try to take some Benadryl over-the-counter at night to help him sleep.  There is really nothing else we can prescribe.  He is on oxycodone, muscle relaxants, Neurontin and anti-inflammatories.

## 2019-07-25 NOTE — Telephone Encounter (Signed)
Patient aware of the below message  

## 2019-07-25 NOTE — Telephone Encounter (Signed)
Patient called triage line this morning stating that he has not been able to sleep all night last night. He is currently taking Oxycodone 5mg  and alternating tylenol and advil in between doses. He also takes gabapentin and robaxin.  He feels that the pain medication is not strong enough and that he has possibly built up a tolerance to it. He has a burning in his foot that is "killing me".  He tried to do some of the exercises that mimic walking and states that he is not ready to be putting weight on his leg.  He requests something stronger so that he can at least get some sleep. He does not feel that he needs to come in for an appointment.  Patient uses [facility] in [place]. CB for patient [phone]

## 2019-07-25 NOTE — Telephone Encounter (Signed)
Please advise 

## 2019-07-27 ENCOUNTER — Other Ambulatory Visit (INDEPENDENT_AMBULATORY_CARE_PROVIDER_SITE_OTHER): Payer: Self-pay

## 2019-07-27 ENCOUNTER — Other Ambulatory Visit: Payer: Self-pay | Admitting: Orthopaedic Surgery

## 2019-07-28 MED ORDER — OXYCODONE HCL 5 MG PO TABS: 5.0000 mg | ORAL_TABLET | ORAL | 0 refills | Status: DC | PRN

## 2019-07-28 NOTE — Telephone Encounter (Signed)
Please advise 

## 2019-07-28 NOTE — Telephone Encounter (Signed)
[facility] patient 

## 2019-08-05 ENCOUNTER — Telehealth: Payer: Self-pay | Admitting: Orthopaedic Surgery

## 2019-08-05 NOTE — Telephone Encounter (Signed)
FYI

## 2019-08-05 NOTE — Telephone Encounter (Signed)
Daniel Solis with [facility] Sol[facility] called to let Dr. Magnus Ivan know that they will be discharging Daniel Solis from in home PT.  Patient has 2-137 degrees of ROM of right knee.  Patient has surpassed all goals and is awaiting his next appointment.  Daniel Solis's CB#[phone].  Thank you.

## 2019-08-14 ENCOUNTER — Ambulatory Visit (INDEPENDENT_AMBULATORY_CARE_PROVIDER_SITE_OTHER): Payer: Medicaid Other | Admitting: Physician Assistant

## 2019-08-14 ENCOUNTER — Encounter: Payer: Self-pay | Admitting: Physician Assistant

## 2019-08-14 ENCOUNTER — Ambulatory Visit (INDEPENDENT_AMBULATORY_CARE_PROVIDER_SITE_OTHER): Payer: Medicaid Other

## 2019-08-14 ENCOUNTER — Other Ambulatory Visit: Payer: Self-pay

## 2019-08-14 DIAGNOSIS — S82201A Unspecified fracture of shaft of right tibia, initial encounter for closed fracture: Secondary | ICD-10-CM | POA: Diagnosis not present

## 2019-08-14 DIAGNOSIS — S82401A Unspecified fracture of shaft of right fibula, initial encounter for closed fracture: Secondary | ICD-10-CM | POA: Diagnosis not present

## 2019-08-14 NOTE — Progress Notes (Addendum)
HPI: [name] returns today follow-up of his right tib-fib fracture.  He is now almost 6 weeks status post IM nailing right tibia fracture.  He states he is having significant pain unless he bears full weight on leg.  He does note some swelling.  He is finished with home physical therapy.  He is walking with a rolling walker.  He is taking no pain medication at this point time only using Tylenol.  Physical exam: Right lower extremity surgical incisions are all healing well no signs of infection.  Right calf supple nontender.  Dorsiflexion plantarflexion right ankle intact.  Radiographs: 2 views right tib-fib shows the proximal and distal fractures to be healing well with good consolidation.  No hardware failure.  Impression: Status post IM nailing right tib-fib fracture 07/05/2019  Plan: Full weightbearing as tolerated on the right lower leg.  Continue work on scar tissue mobilization.  He is out of work until follow-up we will reevaluate his work status at that time.  Questions encouraged and answered.

## 2019-09-15 ENCOUNTER — Other Ambulatory Visit: Payer: Self-pay

## 2019-09-15 ENCOUNTER — Ambulatory Visit (INDEPENDENT_AMBULATORY_CARE_PROVIDER_SITE_OTHER): Payer: Medicaid Other | Admitting: Physician Assistant

## 2019-09-15 ENCOUNTER — Encounter: Payer: Self-pay | Admitting: Physician Assistant

## 2019-09-15 ENCOUNTER — Ambulatory Visit: Payer: Self-pay

## 2019-09-15 DIAGNOSIS — S82201A Unspecified fracture of shaft of right tibia, initial encounter for closed fracture: Secondary | ICD-10-CM

## 2019-09-15 DIAGNOSIS — S82401A Unspecified fracture of shaft of right fibula, initial encounter for closed fracture: Secondary | ICD-10-CM

## 2019-09-15 MED ORDER — HYDROCODONE-ACETAMINOPHEN 5-325 MG PO TABS: 1.0000 | ORAL_TABLET | Freq: Four times a day (QID) | ORAL | 0 refills | Status: DC | PRN

## 2019-09-15 NOTE — Progress Notes (Signed)
HPI: Mr. Parenteau returns now approximately 9 weeks status post IM nailing of right tibia fracture.  He is overall doing well.  He is ambulating with a walker.  He states that he does sometimes go without a walker in the home.  States the pain comes and goes is worse with prolonged ambulation.  He is taking over-the-counter NSAIDs for the pain.  Still having some swelling right leg but this is dissipating.  Physical exam: General well-developed well-nourished male no acute distress  Right lower extremity surgical incisions are all healing well no signs of infection.  Right calf supple nontender.  Dorsiflexion plantarflexion right ankle intact.  Is able to bear full weight on right lower leg and ambulate about the room.  Slight edema of the right lower leg.  Radiographs: 2 views right tib-fib shows consolidation of the proximal tibia lower tibia and fibula fracture.  No hardware failure.  No other bony abnormalities.  Impression: Status post right tibia IM nailing for 1021  Plan: Keep him out of work for the next month.  Reevaluate his work status in a month.  He is to work on range of motion, gait balance and strengthening the right lower leg.  Will obtain 2 views of the right tib-fib on return.  Questions encouraged and answered.

## 2019-09-26 ENCOUNTER — Telehealth: Payer: Self-pay | Admitting: Orthopaedic Surgery

## 2019-09-26 NOTE — Telephone Encounter (Signed)
Patient called,would like his xrays on CD. Call when ready [phone]

## 2019-09-30 ENCOUNTER — Telehealth: Payer: Self-pay | Admitting: Physician Assistant

## 2019-09-30 NOTE — Telephone Encounter (Signed)
Patient called. He would like a refill on hydrocodone. His call back number is [phone]

## 2019-09-30 NOTE — Telephone Encounter (Signed)
Please advise 

## 2019-09-30 NOTE — Telephone Encounter (Signed)
Left message on patients voicemail advising that CD was ready for pickup at front desk.

## 2019-10-01 ENCOUNTER — Other Ambulatory Visit: Payer: Self-pay | Admitting: Physician Assistant

## 2019-10-01 MED ORDER — HYDROCODONE-ACETAMINOPHEN 5-325 MG PO TABS: 1.0000 | ORAL_TABLET | Freq: Four times a day (QID) | ORAL | 0 refills | Status: DC | PRN

## 2019-10-01 NOTE — Telephone Encounter (Signed)
Sent in will be last refill

## 2019-10-01 NOTE — Telephone Encounter (Signed)
LMOM for patient of the below message  

## 2019-10-13 ENCOUNTER — Ambulatory Visit: Payer: Medicaid Other | Admitting: Physician Assistant

## 2019-11-17 ENCOUNTER — Ambulatory Visit: Payer: Medicaid Other | Admitting: Physician Assistant

## 2019-12-15 ENCOUNTER — Encounter: Payer: Self-pay | Admitting: Physician Assistant

## 2019-12-15 ENCOUNTER — Ambulatory Visit: Payer: Self-pay

## 2019-12-15 ENCOUNTER — Ambulatory Visit (INDEPENDENT_AMBULATORY_CARE_PROVIDER_SITE_OTHER): Payer: Medicaid Other | Admitting: Physician Assistant

## 2019-12-15 DIAGNOSIS — S82401G Unspecified fracture of shaft of right fibula, subsequent encounter for closed fracture with delayed healing: Secondary | ICD-10-CM | POA: Diagnosis not present

## 2019-12-15 DIAGNOSIS — S82201G Unspecified fracture of shaft of right tibia, subsequent encounter for closed fracture with delayed healing: Secondary | ICD-10-CM

## 2019-12-15 NOTE — Progress Notes (Signed)
Office Visit Note   Patient: Daniel Solis           Date of Birth: 1975-10-24           MRN: [id number] Visit Date: 12/15/2019              Requested by: No referring provider defined for this encounter. PCP: Patient, No Pcp Per   Assessment & Plan: Visit Diagnoses:  1. Closed fracture of right tibia and fibula with delayed healing, subsequent encounter     Plan: Reminded him that he could have some swelling for up to 1 year postop.  He will continue to wear his compression hose.  He will follow up with [place] as needed.  Questions encouraged and answered.  Follow-Up Instructions: Return if symptoms worsen or fail to improve.   Orders:  Orders Placed This Encounter  Procedures  . XR Tibia/Fibula Right   No orders of the defined types were placed in this encounter.     Procedures: No procedures performed   Clinical Data: No additional findings.   Subjective: Chief Complaint  Patient presents with  . Right Lower Leg - Follow-up    HPI   Daniel Solis returns today in follow-up of his right tib-fib fracture.  He underwent an IM nailing of the right tibia on 07/05/2019.  He states he is back at work.  He does have some swelling about the leg today.  He states that it he has some pain but it is tolerable.  He is wondering if the screw could be possibly backing out at the proximal end of the tibia as he has slight prominence.  Review of Systems No fevers chills.  Objective: Vital Signs: There were no vitals taken for this visit.  Physical Exam Constitutional:      Appearance: He is not ill-appearing or diaphoretic.  Pulmonary:     Effort: Pulmonary effort is normal.  Neurological:     Mental Status: He is alert.     Ortho Exam Right lower leg surgical incisions are all well-healed.  Calf is supple nontender.  He has full extension knee full functioning.  Good dorsiflexion plantarflexion ankle.  There is no palpable hardware.Marland Kitchen  He is pointing to his the tibial  tubercle which is prominent on both legs. Specialty Comments:  No specialty comments available.  Imaging: XR Tibia/Fibula Right  Result Date: 12/15/2019 AP lateral right tib-fib: Abundant callus formation of the right tibia and fibula fractures.  No hardware failure.  No acute findings.    PMFS History: Patient Active Problem List   Diagnosis Date Noted  . Closed fracture of right tibia and fibula 07/04/2019  . Closed fracture of right fibula and tibia   . Paresthesia 02/04/2016  . Smoker 02/13/2011  . Left hemiparesis (HCC) 02/13/2011  . Hemiplegia and hemiparesis 02/11/2011   Past Medical History:  Diagnosis Date  . Anxiety   . Attention deficit disorder   . Cigarette smoker   . Hepatitis C   . Hypertension   . TIA (transient ischemic attack)     Family History  Problem Relation Age of Onset  . Cancer Other     Past Surgical History:  Procedure Laterality Date  . ARM DEBRIDEMENT    . TIBIA IM NAIL INSERTION Right 07/05/2019   Procedure: INTRAMEDULLARY (IM) NAIL TIBIAL;  Surgeon: Kathryne Hitch, MD;  Location: [facility];  Service: Orthopedics;  Laterality: Right;   Social History   Occupational History  . Not on file  Tobacco Use  . Smoking status: Current Every Day Smoker    Packs/day: 1.00    Types: Cigarettes  . Smokeless tobacco: Never Used  Substance and Sexual Activity  . Alcohol use: No  . Drug use: No  . Sexual activity: Not on file

## 2020-05-25 ENCOUNTER — Ambulatory Visit (INDEPENDENT_AMBULATORY_CARE_PROVIDER_SITE_OTHER): Payer: Medicaid Other | Admitting: Orthopaedic Surgery

## 2020-05-25 ENCOUNTER — Other Ambulatory Visit: Payer: Self-pay

## 2020-05-25 ENCOUNTER — Encounter (HOSPITAL_BASED_OUTPATIENT_CLINIC_OR_DEPARTMENT_OTHER): Payer: Self-pay | Admitting: Emergency Medicine

## 2020-05-25 ENCOUNTER — Emergency Department (HOSPITAL_BASED_OUTPATIENT_CLINIC_OR_DEPARTMENT_OTHER)
Admission: EM | Admit: 2020-05-25 | Discharge: 2020-05-25 | Disposition: A | Discharge status: Home / Self Care | Payer: Medicaid Other | Attending: Emergency Medicine | Admitting: Emergency Medicine

## 2020-05-25 ENCOUNTER — Telehealth: Payer: Self-pay

## 2020-05-25 ENCOUNTER — Ambulatory Visit (INDEPENDENT_AMBULATORY_CARE_PROVIDER_SITE_OTHER): Payer: Medicaid Other

## 2020-05-25 ENCOUNTER — Other Ambulatory Visit: Payer: Self-pay | Admitting: Orthopaedic Surgery

## 2020-05-25 VITALS — BP 119/80 | HR 86

## 2020-05-25 DIAGNOSIS — Z5321 Procedure and treatment not carried out due to patient leaving prior to being seen by health care provider: Secondary | ICD-10-CM | POA: Diagnosis not present

## 2020-05-25 DIAGNOSIS — Z8781 Personal history of (healed) traumatic fracture: Secondary | ICD-10-CM | POA: Insufficient documentation

## 2020-05-25 DIAGNOSIS — M79661 Pain in right lower leg: Secondary | ICD-10-CM | POA: Insufficient documentation

## 2020-05-25 DIAGNOSIS — M79604 Pain in right leg: Secondary | ICD-10-CM

## 2020-05-25 DIAGNOSIS — R2241 Localized swelling, mass and lump, right lower limb: Secondary | ICD-10-CM | POA: Insufficient documentation

## 2020-05-25 DIAGNOSIS — M79605 Pain in left leg: Secondary | ICD-10-CM

## 2020-05-25 MED ORDER — HYDROCODONE-ACETAMINOPHEN 5-325 MG PO TABS: 1.0000 | ORAL_TABLET | Freq: Four times a day (QID) | ORAL | 0 refills | Status: DC | PRN

## 2020-05-25 NOTE — Progress Notes (Addendum)
Office Visit Note   Patient: Daniel Solis           Date of Birth: 05-23-1975           MRN: [id number] Visit Date: 05/25/2020              Requested by: No referring provider defined for this encounter. PCP: Patient, No Pcp Per   Assessment & Plan: Visit Diagnoses:  1. Pain in right leg     Plan: Patient will make an appointment to see Dr. Magnus Ivan to discuss proximal interlock screw removal.  Is fracture is healed.  He not handling any other problems then the other interlock screws removed with anterior screw should take care of his current symptoms.  He can use some ice ibuprofen short-term to decrease swelling and irritation.  Follow-Up Instructions: No follow-ups on file.   Orders:  Orders Placed This Encounter  Procedures  . XR Tibia/Fibula Right   No orders of the defined types were placed in this encounter.     Procedures: No procedures performed   Clinical Data: No additional findings.   Subjective: Chief Complaint  Patient presents with  . Right Leg - Pain    Prior surgery with Dr Magnus Ivan 07/05/19-Right IM nail of tibia    HPI patient had tibial nail for tibial fracture treated by Dr. Magnus Ivan with interlocking nail.  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  He was helping his son move and carried objects which were light and now states he cannot walk he has significant pain and points directly over the tibial tubercle where the patellar tendon attaches.  He has compression stockings needs to put them on at least for the right leg to help with the swelling that he has postop.  He needs to look for work activity where he is doing some standing some standing and does not have to walk all day or kneel down on his knee.  Review of Systems updated unchanged from surgery.   Objective: Vital Signs: BP 119/80   Pulse 86   Physical Exam Constitutional:      Appearance: He is well-developed and  well-nourished.  HENT:     Head: Normocephalic and atraumatic.  Eyes:     Extraocular Movements: EOM normal.     Pupils: Pupils are equal, round, and reactive to light.  Neck:     Thyroid: No thyromegaly.     Trachea: No tracheal deviation.  Cardiovascular:     Rate and Rhythm: Normal rate.  Pulmonary:     Effort: Pulmonary effort is normal.     Breath sounds: No wheezing.  Abdominal:     General: Bowel sounds are normal.     Palpations: Abdomen is soft.  Skin:    General: Skin is warm and dry.     Capillary Refill: Capillary refill takes less than 2 seconds.  Neurological:     Mental Status: He is alert and oriented to person, place, and time.  Psychiatric:        Mood and Affect: Mood and affect normal.        Behavior: Behavior normal.        Thought Content: Thought content normal.        Judgment: Judgment normal.     Ortho Exam patient is swelling tenderness directly over the proximal interlock screw at the tibial tubercle adjacent patellar tendon insertion.  Pes bursa is normal.  Mild pitting edema in the right lower extremity.  No venous stasis changes.  Distal interlock screws are normal.  Specialty Comments:  No specialty comments available.  Imaging: XR Tibia/Fibula Right  Result Date: 05/25/2020 2 view x-rays right tib-fib demonstrate intramedullary rod with healed distal third tibial shaft fracture and healed fibular fracture.  Soft tissue swelling over the proximal interlock screw at the tibial tubercle. Impression: Healed tib-fib fracture with intramedullary nail.  Soft tissue swelling over the proximal interlock at the tibial tubercle.    PMFS History: Patient Active Problem List   Diagnosis Date Noted  . Closed fracture of right tibia and fibula 07/04/2019  . Closed fracture of right fibula and tibia   . Paresthesia 02/04/2016  . Smoker 02/13/2011  . Left hemiparesis (HCC) 02/13/2011  . Hemiplegia and hemiparesis 02/11/2011   Past Medical History:   Diagnosis Date  . Anxiety   . Attention deficit disorder   . Cigarette smoker   . Hepatitis C   . Hypertension   . TIA (transient ischemic attack)     Family History  Problem Relation Age of Onset  . Cancer Other     Past Surgical History:  Procedure Laterality Date  . ARM DEBRIDEMENT    . TIBIA IM NAIL INSERTION Right 07/05/2019   Procedure: INTRAMEDULLARY (IM) NAIL TIBIAL;  Surgeon: Kathryne Hitch, MD;  Location: [facility];  Service: Orthopedics;  Laterality: Right;   Social History   Occupational History  . Not on file  Tobacco Use  . Smoking status: Current Every Day Smoker    Packs/day: 1.00    Types: Cigarettes  . Smokeless tobacco: Never Used  Substance and Sexual Activity  . Alcohol use: No  . Drug use: No  . Sexual activity: Not on file

## 2020-05-25 NOTE — Telephone Encounter (Signed)
Pt called stating he is in extreme pain and would like some medication to be called in

## 2020-05-25 NOTE — ED Triage Notes (Addendum)
Reports hx of tib-fib fracture last year to right leg.  Spent the day yesterday helping his son move.  Reports pain started last night.  Seen at ortho today.  Reports they misunderstood the issue.  Unable to bear any weight at this time.  Reports large amount of swelling last night.  Today knot noted under the knee per patient this is where the screws were location.

## 2020-05-25 NOTE — Telephone Encounter (Signed)
I saw that he went to the ER for seeing Dr. Ophelia Charter. I saw the x-rays did not show any fracture or worrisome features and the x-ray showed this fracture healed. I will send in at least a one-time prescription for some hydrocodone to use sparingly.

## 2020-05-25 NOTE — Telephone Encounter (Signed)
Please advise He was seen by Ophelia Charter today since our clinic was full this morning

## 2020-05-25 NOTE — ED Notes (Signed)
Pt states that there is nothing we can do for him so he is leaving. Significant other wheeled him out with a wheelchair. Provider and charge RN made aware.

## 2020-05-26 NOTE — Telephone Encounter (Signed)
Patient aware of the below message  

## 2020-06-01 ENCOUNTER — Ambulatory Visit (INDEPENDENT_AMBULATORY_CARE_PROVIDER_SITE_OTHER): Payer: Medicaid Other | Admitting: Orthopaedic Surgery

## 2020-06-01 ENCOUNTER — Encounter: Payer: Self-pay | Admitting: Orthopaedic Surgery

## 2020-06-01 DIAGNOSIS — T8484XA Pain due to internal orthopedic prosthetic devices, implants and grafts, initial encounter: Secondary | ICD-10-CM | POA: Diagnosis not present

## 2020-06-01 MED ORDER — METHYLPREDNISOLONE 4 MG PO TABS: ORAL_TABLET | ORAL | 0 refills | Status: DC

## 2020-06-01 NOTE — Progress Notes (Signed)
The patient is someone who is almost a year out from surgical fixation and stabilization of a right tib-fib fracture.  He has since healed the fracture but he is dealing with painful prominent proximal interlocking screw near the right knee that has become painful for him.  He saw one of our partners last week and x-rays were obtained showing the fracture healed completely.  He points to the tibial tubercle area as a source of his pain and he has had some swelling in this area.  On exam there is no evidence of infection around his right leg.  There is a painful prominent screw head that can be felt just lateral to the tibial tubercle.  The x-rays that were reviewed from last week show that the fracture is healed completely.  He does have an associated tendinitis and bursitis as it relates to the screw head.  I have recommended removing the right proximal interlocking screw from the tibial nail.  I described the surgery involves.  I will give him a note that we will address his work status.  The surgery should be a quick outpatient surgery and we would see him back in 2 weeks for suture removal.  I did send in some anti-inflammatories and a steroid taper to help with inflammation before we are able to get him scheduled for surgery.  All question concerns were answered and addressed.  We will be in touch about scheduling this screw removal.

## 2020-06-03 ENCOUNTER — Telehealth: Payer: Self-pay | Admitting: Orthopaedic Surgery

## 2020-06-03 NOTE — Telephone Encounter (Signed)
Patient aware directions on the packaging and he can come by at anytime for [place] to print that off for him

## 2020-06-03 NOTE — Telephone Encounter (Signed)
Pt called and said he lost his paper work he received from his appt on the 8th. He would like to know if you can e-mail it to him by chance. I verified his e-mail.  He said he also picked up his prednisone and doesn't know how much he is suppose to take. He would like a call [phone] about the medication and about a follow up appt?

## 2020-06-05 ENCOUNTER — Emergency Department (HOSPITAL_COMMUNITY): Payer: Medicaid Other

## 2020-06-05 ENCOUNTER — Other Ambulatory Visit: Payer: Self-pay

## 2020-06-05 ENCOUNTER — Emergency Department (HOSPITAL_COMMUNITY)
Admission: EM | Admit: 2020-06-05 | Discharge: 2020-06-05 | Disposition: A | Discharge status: Home / Self Care | Payer: Medicaid Other | Attending: Emergency Medicine | Admitting: Emergency Medicine

## 2020-06-05 ENCOUNTER — Encounter (HOSPITAL_COMMUNITY): Payer: Self-pay | Admitting: Emergency Medicine

## 2020-06-05 DIAGNOSIS — Y92410 Unspecified street and highway as the place of occurrence of the external cause: Secondary | ICD-10-CM | POA: Insufficient documentation

## 2020-06-05 DIAGNOSIS — F17210 Nicotine dependence, cigarettes, uncomplicated: Secondary | ICD-10-CM | POA: Insufficient documentation

## 2020-06-05 DIAGNOSIS — Z79899 Other long term (current) drug therapy: Secondary | ICD-10-CM | POA: Diagnosis not present

## 2020-06-05 DIAGNOSIS — Z7982 Long term (current) use of aspirin: Secondary | ICD-10-CM | POA: Diagnosis not present

## 2020-06-05 DIAGNOSIS — S060X1A Concussion with loss of consciousness of 30 minutes or less, initial encounter: Secondary | ICD-10-CM | POA: Diagnosis not present

## 2020-06-05 DIAGNOSIS — S0990XA Unspecified injury of head, initial encounter: Secondary | ICD-10-CM | POA: Diagnosis present

## 2020-06-05 DIAGNOSIS — I10 Essential (primary) hypertension: Secondary | ICD-10-CM | POA: Diagnosis not present

## 2020-06-05 MED ORDER — ACETAMINOPHEN 500 MG PO TABS
1000.0000 mg | ORAL_TABLET | Freq: Once | ORAL | Status: AC
  Administered 2020-06-05: 1000 mg via ORAL
  Filled 2020-06-05: qty 2

## 2020-06-05 MED ORDER — IBUPROFEN 800 MG PO TABS
800.0000 mg | ORAL_TABLET | Freq: Once | ORAL | Status: AC
  Administered 2020-06-05: 800 mg via ORAL
  Filled 2020-06-05: qty 1

## 2020-06-05 NOTE — ED Notes (Signed)
Patient transported to CT 

## 2020-06-05 NOTE — ED Notes (Signed)
Pt verbalized understanding of d/c and follow up care. Ambulatory with steady gait.  
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
## 2020-06-15 ENCOUNTER — Ambulatory Visit (INDEPENDENT_AMBULATORY_CARE_PROVIDER_SITE_OTHER): Payer: Medicaid Other | Admitting: Orthopaedic Surgery

## 2020-06-15 ENCOUNTER — Ambulatory Visit: Payer: Medicaid Other | Admitting: Orthopaedic Surgery

## 2020-06-15 ENCOUNTER — Ambulatory Visit (INDEPENDENT_AMBULATORY_CARE_PROVIDER_SITE_OTHER): Payer: Medicaid Other

## 2020-06-15 DIAGNOSIS — M25552 Pain in left hip: Secondary | ICD-10-CM

## 2020-06-15 DIAGNOSIS — M5442 Lumbago with sciatica, left side: Secondary | ICD-10-CM
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
## 2020-07-14 ENCOUNTER — Telehealth: Payer: Self-pay | Admitting: Orthopaedic Surgery

## 2020-07-14 ENCOUNTER — Other Ambulatory Visit: Payer: Self-pay

## 2020-07-14 MED ORDER — DICLOFENAC SODIUM 75 MG PO TBEC: 75.0000 mg | DELAYED_RELEASE_TABLET | Freq: Two times a day (BID) | ORAL | 1 refills | Status: DC

## 2020-07-14 NOTE — Telephone Encounter (Signed)
Mailbox full Medication called in

## 2020-07-14 NOTE — Telephone Encounter (Signed)
Please advise 

## 2020-07-14 NOTE — Telephone Encounter (Signed)
Call in voltaren 75 mg one po bid prn # 60 . thanks

## 2020-07-14 NOTE — Telephone Encounter (Signed)
Patient aware this was called in for him  

## 2020-07-14 NOTE — Telephone Encounter (Signed)
Patient called requesting non narcotics pain medication. Patient states to be in severe pain. Please call patient with medications have been called in. Patient phone number is [phone].

## 2020-07-16 ENCOUNTER — Telehealth: Payer: Self-pay | Admitting: Orthopaedic Surgery

## 2020-07-16 NOTE — Telephone Encounter (Signed)
Patient called advised the pharmacy said prior approval is needed in order to fill the Rx. Please see previous note.   The number to contact patient is [phone]

## 2020-07-16 NOTE — Telephone Encounter (Signed)
Patient aware this prior Daniel Solis was approved and his pharmacy should have that ready for him soon

## 2020-08-11 ENCOUNTER — Encounter: Payer: Self-pay | Admitting: Physician Assistant

## 2020-08-11 ENCOUNTER — Ambulatory Visit (INDEPENDENT_AMBULATORY_CARE_PROVIDER_SITE_OTHER): Payer: Medicaid Other | Admitting: Physician Assistant

## 2020-08-11 DIAGNOSIS — M722 Plantar fascial fibromatosis: Secondary | ICD-10-CM

## 2020-08-11 NOTE — Progress Notes (Signed)
Office Visit Note   Patient: Daniel Solis           Date of Birth: [birth year]           MRN: [id number] Visit Date: 08/11/2020              Requested by: Tracey Harries, MD [address] [place],  [place] [address] PCP: Tracey Harries, MD   Assessment & Plan: Visit Diagnoses:  1. Plantar fasciitis of right foot     Plan: Discussed with him gastrocsoleus stretching exercises and these were demonstrated.  Discussed proper shoe wear with him avoidance of open back shoes and changing his shoes daily.  He is taking ibuprofen along with diclofenac oral and recommend he take one of the other but not both.  He could use some diclofenac gel over the heel up to 4 g 4 times daily.  He will continue to work on getting clearance from neurology and let us know once he is cleared for surgery.  We will see him back 2 weeks status post removal of screw from IM nailing of a right proximal tibia fracture.  Follow-Up Instructions: Return if symptoms worsen or fail to improve.   Orders:  No orders of the defined types were placed in this encounter.  No orders of the defined types were placed in this encounter.     Procedures: No procedures performed   Clinical Data: No additional findings.   Subjective: Chief Complaint  Patient presents with  . Right Leg - Pain    HPI [name] comes in today with right heel pain.  He states the heel bothers him whenever he first gets up to ambulate particularly in the morning.  He has had no new injury.  He still waiting on neurology for clearance to undergo removal of screw from an IM nailing of tibia fracture.  He states that the MRI of his head is scheduled in the near future.  Review of Systems  Constitutional: Negative for chills and fever.     Objective: Vital Signs: There were no vitals taken for this visit.  Physical Exam Constitutional:      Appearance: He is not ill-appearing or diaphoretic.  Pulmonary:     Effort:  Pulmonary effort is normal.  Neurological:     Mental Status: He is alert.  Psychiatric:        Mood and Affect: Mood normal.     Ortho Exam Right foot dorsal pedal pulse intact.  He has good dorsiflexion plantarflexion ankle without pain.  Tight gastroc on the right.  Tenderness over the right plantar fascial area maximally over the medial tubercle of the calcaneus.  Right Achilles is intact and nontender.  No rashes impending ulcers or ulcers of the right foot. Specialty Comments:  No specialty comments available.  Imaging: No results found.   PMFS History: Patient Active Problem List   Diagnosis Date Noted  . Closed fracture of right tibia and fibula 07/04/2019  . Closed fracture of right fibula and tibia   . Paresthesia 02/04/2016  . Smoker 02/13/2011  . Left hemiparesis (HCC) 02/13/2011  . Hemiplegia and hemiparesis 02/11/2011   Past Medical History:  Diagnosis Date  . Anxiety   . Attention deficit disorder   . Cigarette smoker   . Hepatitis C   . Hypertension   . TIA (transient ischemic attack)     Family History  Problem Relation Age of Onset  . Cancer Other  Past Surgical History:  Procedure Laterality Date  . ARM DEBRIDEMENT    . TIBIA IM NAIL INSERTION Right 07/05/2019   Procedure: INTRAMEDULLARY (IM) NAIL TIBIAL;  Surgeon: Kathryne Hitch, MD;  Location: [facility];  Service: Orthopedics;  Laterality: Right;   Social History   Occupational History  . Not on file  Tobacco Use  . Smoking status: Current Every Day Smoker    Packs/day: 1.00    Types: Cigarettes  . Smokeless tobacco: Never Used  Substance and Sexual Activity  . Alcohol use: No  . Drug use: No  . Sexual activity: Not on file

## 2020-09-20 ENCOUNTER — Other Ambulatory Visit: Payer: Self-pay

## 2020-09-20 ENCOUNTER — Other Ambulatory Visit: Payer: Self-pay | Admitting: Physician Assistant

## 2020-09-21 ENCOUNTER — Other Ambulatory Visit: Payer: Self-pay

## 2020-09-21 ENCOUNTER — Encounter (HOSPITAL_BASED_OUTPATIENT_CLINIC_OR_DEPARTMENT_OTHER): Payer: Self-pay | Admitting: Orthopaedic Surgery

## 2020-09-28 ENCOUNTER — Encounter (HOSPITAL_BASED_OUTPATIENT_CLINIC_OR_DEPARTMENT_OTHER)
Admission: RE | Admit: 2020-09-28 | Discharge: 2020-09-28 | Disposition: A | Discharge status: Home / Self Care | Payer: Medicaid Other | Source: Ambulatory Visit | Attending: Orthopaedic Surgery | Admitting: Orthopaedic Surgery

## 2020-09-28 DIAGNOSIS — F17210 Nicotine dependence, cigarettes, uncomplicated: Secondary | ICD-10-CM | POA: Diagnosis not present

## 2020-09-28 DIAGNOSIS — Z79899 Other long term (current) drug therapy: Secondary | ICD-10-CM | POA: Diagnosis not present

## 2020-09-28 DIAGNOSIS — Y831 Surgical operation with implant of artificial internal device as the cause of abnormal reaction of the patient, or of later complication, without mention of misadventure at the time of the procedure: Secondary | ICD-10-CM | POA: Diagnosis not present

## 2020-09-28 DIAGNOSIS — T8484XA Pain due to internal orthopedic prosthetic devices, implants and grafts, initial encounter: Secondary | ICD-10-CM | POA: Diagnosis present

## 2020-09-28 NOTE — Progress Notes (Signed)

## 2020-09-30 ENCOUNTER — Other Ambulatory Visit: Payer: Self-pay

## 2020-09-30 ENCOUNTER — Ambulatory Visit (HOSPITAL_BASED_OUTPATIENT_CLINIC_OR_DEPARTMENT_OTHER): Payer: Medicaid Other | Admitting: Anesthesiology

## 2020-09-30 ENCOUNTER — Ambulatory Visit (HOSPITAL_BASED_OUTPATIENT_CLINIC_OR_DEPARTMENT_OTHER)
Admission: RE | Admit: 2020-09-30 | Discharge: 2020-09-30 | Disposition: A | Discharge status: Home / Self Care | Payer: Medicaid Other | Source: Ambulatory Visit | Attending: Orthopaedic Surgery | Admitting: Orthopaedic Surgery

## 2020-09-30 ENCOUNTER — Encounter (HOSPITAL_BASED_OUTPATIENT_CLINIC_OR_DEPARTMENT_OTHER): Payer: Self-pay | Admitting: Orthopaedic Surgery

## 2020-09-30 ENCOUNTER — Encounter (HOSPITAL_BASED_OUTPATIENT_CLINIC_OR_DEPARTMENT_OTHER)
Admission: RE | Disposition: A | Discharge status: Home / Self Care | Payer: Self-pay | Source: Ambulatory Visit | Attending: Orthopaedic Surgery

## 2020-09-30 DIAGNOSIS — Y831 Surgical operation with implant of artificial internal device as the cause of abnormal reaction of the patient, or of later complication, without mention of misadventure at the time of the procedure: Secondary | ICD-10-CM | POA: Insufficient documentation

## 2020-09-30 DIAGNOSIS — F17210 Nicotine dependence, cigarettes, uncomplicated: Secondary | ICD-10-CM | POA: Insufficient documentation

## 2020-09-30 DIAGNOSIS — T8484XA Pain due to internal orthopedic prosthetic devices, implants and grafts, initial encounter: Secondary | ICD-10-CM | POA: Diagnosis not present

## 2020-09-30 DIAGNOSIS — Z79899 Other long term (current) drug therapy: Secondary | ICD-10-CM | POA: Insufficient documentation

## 2020-09-30 HISTORY — PX: HARDWARE REMOVAL: SHX979

## 2020-09-30 HISTORY — DX: Pain due to internal orthopedic prosthetic devices, implants and grafts, initial encounter: T84.84XA

## 2020-09-30 SURGERY — REMOVAL, HARDWARE
Anesthesia: Monitor Anesthesia Care | Site: Leg Upper | Laterality: Right

## 2020-09-30 MED ORDER — DEXMEDETOMIDINE (PRECEDEX) IN NS 20 MCG/5ML (4 MCG/ML) IV SYRINGE
PREFILLED_SYRINGE | INTRAVENOUS | Status: DC | PRN
  Administered 2020-09-30: 12 ug via INTRAVENOUS

## 2020-09-30 MED ORDER — OXYCODONE HCL 5 MG/5ML PO SOLN: 5.0000 mg | Freq: Once | ORAL | Status: AC | PRN

## 2020-09-30 MED ORDER — FENTANYL CITRATE (PF) 100 MCG/2ML IJ SOLN
INTRAMUSCULAR | Status: DC | PRN
  Administered 2020-09-30: 50 ug via INTRAVENOUS

## 2020-09-30 MED ORDER — METOPROLOL TARTRATE 25 MG PO TABS
ORAL_TABLET | ORAL | Status: AC
  Filled 2020-09-30: qty 2

## 2020-09-30 MED ORDER — PROPOFOL 500 MG/50ML IV EMUL
INTRAVENOUS | Status: AC
  Filled 2020-09-30: qty 50

## 2020-09-30 MED ORDER — OXYCODONE HCL 5 MG PO TABS
ORAL_TABLET | ORAL | Status: AC
  Filled 2020-09-30: qty 1

## 2020-09-30 MED ORDER — ONDANSETRON HCL 4 MG/2ML IJ SOLN: 4.0000 mg | Freq: Once | INTRAMUSCULAR | Status: DC | PRN

## 2020-09-30 MED ORDER — PROPOFOL 500 MG/50ML IV EMUL
INTRAVENOUS | Status: DC | PRN
  Administered 2020-09-30: 125 ug/kg/min via INTRAVENOUS

## 2020-09-30 MED ORDER — CEFAZOLIN SODIUM-DEXTROSE 2-4 GM/100ML-% IV SOLN
2.0000 g | INTRAVENOUS | Status: AC
  Administered 2020-09-30: 2 g via INTRAVENOUS

## 2020-09-30 MED ORDER — BUPIVACAINE HCL (PF) 0.25 % IJ SOLN
INTRAMUSCULAR | Status: DC | PRN
  Administered 2020-09-30: 10 mL

## 2020-09-30 MED ORDER — FENTANYL CITRATE (PF) 100 MCG/2ML IJ SOLN: 25.0000 ug | INTRAMUSCULAR | Status: DC | PRN

## 2020-09-30 MED ORDER — PROPOFOL 10 MG/ML IV BOLUS
INTRAVENOUS | Status: DC | PRN
  Administered 2020-09-30 (×2): 20 mg via INTRAVENOUS
  Administered 2020-09-30: 40 mg via INTRAVENOUS

## 2020-09-30 MED ORDER — OXYCODONE HCL 5 MG PO TABS
5.0000 mg | ORAL_TABLET | Freq: Once | ORAL | Status: AC | PRN
  Administered 2020-09-30: 5 mg via ORAL

## 2020-09-30 MED ORDER — ONDANSETRON HCL 4 MG/2ML IJ SOLN
INTRAMUSCULAR | Status: AC
  Filled 2020-09-30: qty 2

## 2020-09-30 MED ORDER — FENTANYL CITRATE (PF) 100 MCG/2ML IJ SOLN
INTRAMUSCULAR | Status: AC
  Filled 2020-09-30: qty 2

## 2020-09-30 MED ORDER — MIDAZOLAM HCL 5 MG/5ML IJ SOLN
INTRAMUSCULAR | Status: DC | PRN
  Administered 2020-09-30: 2 mg via INTRAVENOUS

## 2020-09-30 MED ORDER — METOPROLOL TARTRATE 25 MG PO TABS
50.0000 mg | ORAL_TABLET | Freq: Once | ORAL | Status: AC
  Administered 2020-09-30: 50 mg via ORAL

## 2020-09-30 MED ORDER — MIDAZOLAM HCL 2 MG/2ML IJ SOLN
INTRAMUSCULAR | Status: AC
  Filled 2020-09-30: qty 2

## 2020-09-30 MED ORDER — LACTATED RINGERS IV SOLN: INTRAVENOUS | Status: DC

## 2020-09-30 MED ORDER — HYDROCODONE-ACETAMINOPHEN 5-325 MG PO TABS: 1.0000 | ORAL_TABLET | Freq: Four times a day (QID) | ORAL | 0 refills | Status: DC | PRN

## 2020-09-30 MED ORDER — CEFAZOLIN SODIUM-DEXTROSE 2-4 GM/100ML-% IV SOLN
INTRAVENOUS | Status: AC
  Filled 2020-09-30: qty 100

## 2020-09-30 SURGICAL SUPPLY — 47 items
BANDAGE ESMARK 6X9 LF (GAUZE/BANDAGES/DRESSINGS) ×1 IMPLANT
BLADE SURG 15 STRL LF DISP TIS (BLADE) ×2 IMPLANT
BLADE SURG 15 STRL SS (BLADE) ×4
BNDG CMPR 9X6 STRL LF SNTH (GAUZE/BANDAGES/DRESSINGS) ×1
BNDG COHESIVE 4X5 TAN STRL (GAUZE/BANDAGES/DRESSINGS) ×2 IMPLANT
BNDG CONFORM 4 STRL LF (GAUZE/BANDAGES/DRESSINGS) IMPLANT
BNDG ELASTIC 6X5.8 VLCR STR LF (GAUZE/BANDAGES/DRESSINGS) IMPLANT
BNDG ESMARK 6X9 LF (GAUZE/BANDAGES/DRESSINGS) ×2
BNDG GAUZE ELAST 4 BULKY (GAUZE/BANDAGES/DRESSINGS) IMPLANT
CANISTER SUCT 1200ML W/VALVE (MISCELLANEOUS) IMPLANT
COVER BACK TABLE 60X90IN (DRAPES) ×2 IMPLANT
DRAPE EXTREMITY T 121X128X90 (DISPOSABLE) ×2 IMPLANT
DRAPE U-SHAPE 47X51 STRL (DRAPES) IMPLANT
DRSG EMULSION OIL 3X3 NADH (GAUZE/BANDAGES/DRESSINGS) ×2 IMPLANT
DURAPREP 26ML APPLICATOR (WOUND CARE) ×2 IMPLANT
ELECT REM PT RETURN 9FT ADLT (ELECTROSURGICAL) ×2
ELECTRODE REM PT RTRN 9FT ADLT (ELECTROSURGICAL) ×1 IMPLANT
GAUZE 4X4 16PLY ~~LOC~~+RFID DBL (SPONGE) IMPLANT
GLOVE SRG 8 PF TXTR STRL LF DI (GLOVE) ×1 IMPLANT
GLOVE SURG ENC MOIS LTX SZ7.5 (GLOVE) ×2 IMPLANT
GLOVE SURG UNDER POLY LF SZ8 (GLOVE) ×2
GOWN STRL REUS W/ TWL LRG LVL3 (GOWN DISPOSABLE) ×1 IMPLANT
GOWN STRL REUS W/ TWL XL LVL3 (GOWN DISPOSABLE) ×1 IMPLANT
GOWN STRL REUS W/TWL LRG LVL3 (GOWN DISPOSABLE) ×2
GOWN STRL REUS W/TWL XL LVL3 (GOWN DISPOSABLE) ×2
NEEDLE HYPO 22GX1.5 SAFETY (NEEDLE) ×2 IMPLANT
NS IRRIG 1000ML POUR BTL (IV SOLUTION) ×2 IMPLANT
PACK BASIN DAY SURGERY FS (CUSTOM PROCEDURE TRAY) ×2 IMPLANT
PAD CAST 4YDX4 CTTN HI CHSV (CAST SUPPLIES) ×2 IMPLANT
PADDING CAST ABS 4INX4YD NS (CAST SUPPLIES) ×1
PADDING CAST ABS COTTON 4X4 ST (CAST SUPPLIES) ×1 IMPLANT
PADDING CAST COTTON 4X4 STRL (CAST SUPPLIES) ×4
PENCIL SMOKE EVACUATOR (MISCELLANEOUS) ×2 IMPLANT
SHEET MEDIUM DRAPE 40X70 STRL (DRAPES) ×4 IMPLANT
SPONGE T-LAP 4X18 ~~LOC~~+RFID (SPONGE) ×2 IMPLANT
STOCKINETTE IMPERVIOUS LG (DRAPES) ×2 IMPLANT
SUCTION FRAZIER HANDLE 10FR (MISCELLANEOUS)
SUCTION TUBE FRAZIER 10FR DISP (MISCELLANEOUS) IMPLANT
SUT ETHILON 3 0 PS 1 (SUTURE) ×2 IMPLANT
SUT ETHILON 4 0 PS 2 18 (SUTURE) IMPLANT
SUT VIC AB 3-0 FS2 27 (SUTURE) ×2 IMPLANT
SUT VICRYL 4-0 PS2 18IN ABS (SUTURE) IMPLANT
SYR BULB EAR ULCER 3OZ GRN STR (SYRINGE) ×2 IMPLANT
SYR CONTROL 10ML LL (SYRINGE) IMPLANT
TUBE CONNECTING 20X1/4 (TUBING) IMPLANT
UNDERPAD 30X36 HEAVY ABSORB (UNDERPADS AND DIAPERS) ×2 IMPLANT
YANKAUER SUCT BULB TIP NO VENT (SUCTIONS) IMPLANT
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
## 2020-09-30 NOTE — Brief Op Note (Signed)
09/30/2020  10:35 AM  PATIENT:  Daniel Solis  45 y.o. male  PRE-OPERATIVE DIAGNOSIS:  painful, prominent screw right tibia  POST-OPERATIVE DIAGNOSIS:  painful, prominent screw right tibia  PROCEDURE:  Procedure(s): SCREW REMOVAL RIGHT TIBIA (Right)  SURGEON:  Surgeon(s) and Role:    Kathryne Hitch, MD - Primary  ANESTHESIA:   local and IV sedation  COUNTS:  YES  DICTATION: .Other Dictation: Dictation Number [id number]  PLAN OF CARE: Discharge to home after PACU  PATIENT DISPOSITION:  PACU - hemodynamically stable.   Delay start of Pharmacological VTE agent (>24hrs) due to surgical blood loss or risk of bleeding: no

## 2020-09-30 NOTE — Transfer of Care (Signed)
Immediate Anesthesia Transfer of Care Note  Patient: Daniel Solis  Procedure(s) Performed: SCREW REMOVAL RIGHT TIBIA (Right: Leg Upper)  Patient Location: PACU  Anesthesia Type:MAC  Level of Consciousness: awake, alert  and oriented  Airway & Oxygen Therapy: Patient Spontanous Breathing and Patient connected to face mask oxygen  Post-op Assessment: Report given to RN and Post -op Vital signs reviewed and stable  Post vital signs: Reviewed and stable  Last Vitals:  Vitals Value Taken Time  BP    Temp    Pulse 65 09/30/20 1037  Resp    SpO2 98 % 09/30/20 1037  Vitals shown include unvalidated device data.  Last Pain:  Vitals:   09/30/20 0754  TempSrc: Oral  PainSc: 0-No pain         Complications: No notable events documented.

## 2020-09-30 NOTE — Anesthesia Postprocedure Evaluation (Signed)
Anesthesia Post Note  Patient: [name]  Procedure(s) Performed: SCREW REMOVAL RIGHT TIBIA (Right: Leg Upper)     Patient location during evaluation: PACU Anesthesia Type: MAC Level of consciousness: awake and alert and oriented Pain management: pain level controlled Vital Signs Assessment: post-procedure vital signs reviewed and stable Respiratory status: spontaneous breathing, nonlabored ventilation and respiratory function stable Cardiovascular status: stable and blood pressure returned to baseline Postop Assessment: no apparent nausea or vomiting Anesthetic complications: no   No notable events documented.  Last Vitals:  Vitals:   09/30/20 0754 09/30/20 1037  BP: (!) 148/95   Pulse: 76   Resp: 18   Temp: 36.8 C 36.7 C  SpO2: 100%     Last Pain:  Vitals:   09/30/20 1100  TempSrc:   PainSc: 2                  Braya Habermehl A.

## 2020-09-30 NOTE — Discharge Instructions (Addendum)
You can get your current dressing wet in the shower daily. Do expect the dressing to be blood-tinged. You can remove that dressing in 5 to 6 days and then get your actual incision wet in the shower daily. Place a large Band-Aid over the incision daily after each shower to protect the sutures. Increase her activities as comfort allows.     Post Anesthesia Home Care Instructions  Activity: Get plenty of rest for the remainder of the day. A responsible individual must stay with you for 24 hours following the procedure.  For the next 24 hours, DO NOT: -Drive a car -Advertising copywriter -Drink alcoholic beverages -Take any medication unless instructed by your physician -Make any legal decisions or sign important papers.  Meals: Start with liquid foods such as gelatin or soup. Progress to regular foods as tolerated. Avoid greasy, spicy, heavy foods. If nausea and/or vomiting occur, drink only clear liquids until the nausea and/or vomiting subsides. Call your physician if vomiting continues.  Special Instructions/Symptoms: Your throat may feel dry or sore from the anesthesia or the breathing tube placed in your throat during surgery. If this causes discomfort, gargle with warm salt water. The discomfort should disappear within 24 hours.  If you had a scopolamine patch placed behind your ear for the management of post- operative nausea and/or vomiting:  1. The medication in the patch is effective for 72 hours, after which it should be removed.  Wrap patch in a tissue and discard in the trash. Wash hands thoroughly with soap and water. 2. You may remove the patch earlier than 72 hours if you experience unpleasant side effects which may include dry mouth, dizziness or visual disturbances. 3. Avoid touching the patch. Wash your hands with soap and water after contact with the patch.

## 2020-09-30 NOTE — Anesthesia Preprocedure Evaluation (Addendum)
Anesthesia Evaluation  Patient identified by MRN, date of birth, ID band Patient awake    Reviewed: Allergy & Precautions, NPO status , Patient's Chart, lab work & pertinent test results, reviewed documented beta blocker date and time   Airway Mallampati: II  TM Distance: >3 FB Neck ROM: Full    Dental no notable dental hx. (+) Teeth Intact, Dental Advisory Given   Pulmonary Current Smoker,  Reactive airways   Pulmonary exam normal breath sounds clear to auscultation       Cardiovascular hypertension, Pt. on medications and Pt. on home beta blockers Normal cardiovascular exam Rhythm:Regular Rate:Normal     Neuro/Psych PSYCHIATRIC DISORDERS Anxiety ADHDTIA   GI/Hepatic negative GI ROS, (+) Hepatitis -, CTreated and cured of Hepatitis C   Endo/Other  negative endocrine ROS  Renal/GU negative Renal ROS  negative genitourinary   Musculoskeletal Painful screw IM nail right proximal tibia Hx/o closed Fx right tibia/Fibula   Abdominal (+) + obese,   Peds  Hematology negative hematology ROS (+)   Anesthesia Other Findings   Reproductive/Obstetrics ED                            Anesthesia Physical Anesthesia Plan  ASA: 2  Anesthesia Plan: MAC   Post-op Pain Management:    Induction: Intravenous  PONV Risk Score and Plan: 1 and Treatment may vary due to age or medical condition and Propofol infusion  Airway Management Planned: Natural Airway and Simple Face Mask  Additional Equipment:   Intra-op Plan:   Post-operative Plan:   Informed Consent: I have reviewed the patients History and Physical, chart, labs and discussed the procedure including the risks, benefits and alternatives for the proposed anesthesia with the patient or authorized representative who has indicated his/her understanding and acceptance.     Dental advisory given  Plan Discussed with: CRNA and  Anesthesiologist  Anesthesia Plan Comments:        Anesthesia Quick Evaluation
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
## 2020-09-30 NOTE — Anesthesia Procedure Notes (Signed)
Procedure Name: MAC Date/Time: 09/30/2020 10:04 AM Performed by: Glory Buff, CRNA Oxygen Delivery Method: Simple face mask

## 2020-10-01 ENCOUNTER — Encounter (HOSPITAL_BASED_OUTPATIENT_CLINIC_OR_DEPARTMENT_OTHER): Payer: Self-pay | Admitting: Orthopaedic Surgery

## 2020-10-13 ENCOUNTER — Ambulatory Visit (INDEPENDENT_AMBULATORY_CARE_PROVIDER_SITE_OTHER): Payer: Medicaid Other | Admitting: Orthopaedic Surgery

## 2020-10-13 ENCOUNTER — Encounter: Payer: Self-pay | Admitting: Orthopaedic Surgery

## 2020-10-13 DIAGNOSIS — Z9889 Other specified postprocedural states: Secondary | ICD-10-CM
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
## 2021-06-06 ENCOUNTER — Inpatient Hospital Stay (HOSPITAL_COMMUNITY)
Admission: EM | Admit: 2021-06-06 | Discharge: 2021-06-09 | DRG: 897 | Disposition: A | Discharge status: Home / Self Care | Payer: Medicaid Other | Attending: Internal Medicine | Admitting: Internal Medicine

## 2021-06-06 ENCOUNTER — Emergency Department (HOSPITAL_COMMUNITY): Payer: Medicaid Other

## 2021-06-06 ENCOUNTER — Other Ambulatory Visit: Payer: Self-pay

## 2021-06-06 DIAGNOSIS — F1020 Alcohol dependence, uncomplicated: Secondary | ICD-10-CM | POA: Diagnosis present

## 2021-06-06 DIAGNOSIS — F19931 Other psychoactive substance use, unspecified with withdrawal delirium: Secondary | ICD-10-CM | POA: Diagnosis not present

## 2021-06-06 DIAGNOSIS — F10239 Alcohol dependence with withdrawal, unspecified: Principal | ICD-10-CM | POA: Diagnosis present

## 2021-06-06 DIAGNOSIS — I10 Essential (primary) hypertension: Secondary | ICD-10-CM | POA: Diagnosis present

## 2021-06-06 DIAGNOSIS — Y900 Blood alcohol level of less than 20 mg/100 ml: Secondary | ICD-10-CM | POA: Diagnosis present

## 2021-06-06 DIAGNOSIS — Z79899 Other long term (current) drug therapy: Secondary | ICD-10-CM

## 2021-06-06 DIAGNOSIS — F10939 Alcohol use, unspecified with withdrawal, unspecified: Secondary | ICD-10-CM

## 2021-06-06 DIAGNOSIS — F19939 Other psychoactive substance use, unspecified with withdrawal, unspecified: Secondary | ICD-10-CM

## 2021-06-06 DIAGNOSIS — Z8673 Personal history of transient ischemic attack (TIA), and cerebral infarction without residual deficits: Secondary | ICD-10-CM

## 2021-06-06 DIAGNOSIS — D72829 Elevated white blood cell count, unspecified: Secondary | ICD-10-CM | POA: Diagnosis present

## 2021-06-06 DIAGNOSIS — Z20822 Contact with and (suspected) exposure to covid-19: Secondary | ICD-10-CM | POA: Diagnosis present

## 2021-06-06 DIAGNOSIS — R569 Unspecified convulsions: Secondary | ICD-10-CM | POA: Diagnosis not present

## 2021-06-06 DIAGNOSIS — F909 Attention-deficit hyperactivity disorder, unspecified type: Secondary | ICD-10-CM | POA: Diagnosis present

## 2021-06-06 DIAGNOSIS — I158 Other secondary hypertension: Secondary | ICD-10-CM | POA: Diagnosis not present

## 2021-06-06 DIAGNOSIS — F17200 Nicotine dependence, unspecified, uncomplicated: Secondary | ICD-10-CM | POA: Diagnosis present

## 2021-06-06 DIAGNOSIS — F17210 Nicotine dependence, cigarettes, uncomplicated: Secondary | ICD-10-CM | POA: Diagnosis present

## 2021-06-06 DIAGNOSIS — F411 Generalized anxiety disorder: Secondary | ICD-10-CM | POA: Diagnosis present

## 2021-06-06 DIAGNOSIS — E876 Hypokalemia: Secondary | ICD-10-CM | POA: Diagnosis not present

## 2021-06-06 LAB — CBC WITH DIFFERENTIAL/PLATELET
Abs Immature Granulocytes: 0.12 10*3/uL — ABNORMAL HIGH (ref 0.00–0.07)
Basophils Absolute: 0.1 10*3/uL (ref 0.0–0.1)
Basophils Relative: 1 %
Eosinophils Absolute: 0.1 10*3/uL (ref 0.0–0.5)
Eosinophils Relative: 1 %
HCT: 53.1 % — ABNORMAL HIGH (ref 39.0–52.0)
Hemoglobin: 17.9 g/dL — ABNORMAL HIGH (ref 13.0–17.0)
Immature Granulocytes: 1 %
Lymphocytes Relative: 28 %
Lymphs Abs: 3.2 10*3/uL (ref 0.7–4.0)
MCH: 30.3 pg (ref 26.0–34.0)
MCHC: 33.7 g/dL (ref 30.0–36.0)
MCV: 89.8 fL (ref 80.0–100.0)
Monocytes Absolute: 0.8 10*3/uL (ref 0.1–1.0)
Monocytes Relative: 7 %
Neutro Abs: 7.2 10*3/uL (ref 1.7–7.7)
Neutrophils Relative %: 62 %
Platelets: 168 10*3/uL (ref 150–400)
RBC: 5.91 MIL/uL — ABNORMAL HIGH (ref 4.22–5.81)
RDW: 12.8 % (ref 11.5–15.5)
WBC: 11.5 10*3/uL — ABNORMAL HIGH (ref 4.0–10.5)
nRBC: 0 % (ref 0.0–0.2)

## 2021-06-06 LAB — COMPREHENSIVE METABOLIC PANEL
ALT: 30 U/L (ref 0–44)
AST: 30 U/L (ref 15–41)
Albumin: 3.8 g/dL (ref 3.5–5.0)
Alkaline Phosphatase: 60 U/L (ref 38–126)
Anion gap: 15 (ref 5–15)
BUN: 17 mg/dL (ref 6–20)
CO2: 20 mmol/L — ABNORMAL LOW (ref 22–32)
Calcium: 9.1 mg/dL (ref 8.9–10.3)
Chloride: 102 mmol/L (ref 98–111)
Creatinine, Ser: 1.11 mg/dL (ref 0.61–1.24)
GFR, Estimated: >60 mL/min (ref >60)
Glucose, Bld: 142 mg/dL — ABNORMAL HIGH (ref 70–99)
Potassium: 3.6 mmol/L (ref 3.5–5.1)
Sodium: 137 mmol/L (ref 135–145)
Total Bilirubin: 0.8 mg/dL (ref 0.3–1.2)
Total Protein: 6.7 g/dL (ref 6.5–8.1)

## 2021-06-06 LAB — ETHANOL: Alcohol, Ethyl (B): <10 mg/dL (ref <10)

## 2021-06-06 LAB — RESP PANEL BY RT-PCR (FLU A&B, COVID) ARPGX2
Influenza A by PCR: NEGATIVE
Influenza B by PCR: NEGATIVE
SARS Coronavirus 2 by RT PCR: NEGATIVE

## 2021-06-06 LAB — CBG MONITORING, ED: Glucose-Capillary: 138 mg/dL — ABNORMAL HIGH (ref 70–99)

## 2021-06-06 MED ORDER — LORAZEPAM 2 MG/ML IJ SOLN
1.0000 mg | INTRAMUSCULAR | Status: DC | PRN
  Administered 2021-06-06 – 2021-06-08 (×6): 2 mg via INTRAVENOUS
  Filled 2021-06-06 (×6): qty 1

## 2021-06-06 MED ORDER — LACTATED RINGERS IV SOLN
INTRAVENOUS | Status: AC
  Administered 2021-06-07: 1000 mL via INTRAVENOUS

## 2021-06-06 MED ORDER — LORAZEPAM 2 MG/ML IJ SOLN
2.0000 mg | Freq: Once | INTRAMUSCULAR | Status: AC
  Administered 2021-06-06: 2 mg via INTRAVENOUS
  Filled 2021-06-06: qty 1

## 2021-06-06 MED ORDER — ADULT MULTIVITAMIN W/MINERALS CH
1.0000 | ORAL_TABLET | Freq: Every day | ORAL | Status: DC
  Administered 2021-06-06 – 2021-06-09 (×4): 1 via ORAL
  Filled 2021-06-06 (×4): qty 1

## 2021-06-06 MED ORDER — THIAMINE HCL 100 MG PO TABS
100.0000 mg | ORAL_TABLET | Freq: Every day | ORAL | Status: DC
  Administered 2021-06-06 – 2021-06-09 (×4): 100 mg via ORAL
  Filled 2021-06-06 (×4): qty 1

## 2021-06-06 MED ORDER — THIAMINE HCL 100 MG/ML IJ SOLN
100.0000 mg | Freq: Every day | INTRAMUSCULAR | Status: DC
  Filled 2021-06-06: qty 2

## 2021-06-06 MED ORDER — FOLIC ACID 1 MG PO TABS
1.0000 mg | ORAL_TABLET | Freq: Every day | ORAL | Status: DC
  Administered 2021-06-06 – 2021-06-09 (×4): 1 mg via ORAL
  Filled 2021-06-06 (×4): qty 1

## 2021-06-06 MED ORDER — LORAZEPAM 1 MG PO TABS: 1.0000 mg | ORAL_TABLET | ORAL | Status: DC | PRN

## 2021-06-06 NOTE — Assessment & Plan Note (Addendum)
Continue home metoprolol 50mg  BID

## 2021-06-06 NOTE — Assessment & Plan Note (Addendum)
Secondary to alcohol abuse.  Patient drinks up to 2 pints of vodka daily and attempted to quit 2 days ago. -Admit to stepdown unit -Placed on q1hr CIWA protocol  -Placed on seizure precaution -CT head negative.  Also had MRI of the brain on 07/2020 that was negative. -obtain EEG -give folic, thiamine and MTV

## 2021-06-06 NOTE — ED Triage Notes (Signed)
BIB [facility] from home. Witnessed seizure approx 30 seconds, witnessed by wife. No prior history, stopped drinking alcohol cold [place] approx 2 days ago - "pint of liquor a day" use. Wife described seizure as "shaking," EMS reports pt was post-ictal on arrival. No inconinence, pt did bite left side of tongue and noted abrasion to right forehead Pt does not take blood thinners but does have history of TIA with no deficits. ? ?EMS had initially placed c collar but pt reportedly did not tolerate so collar was removed. ? ?186 glucose, 130HR 180/100.  ?
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
## 2021-06-06 NOTE — ED Provider Notes (Signed)
[facility] EMERGENCY DEPARTMENT Provider Note   CSN: [id number] Arrival date & time: 06/06/21  1740     History  Chief Complaint  Patient presents with   Seizures    Daniel Solis is a 46 y.o. male.  46 year old male with prior medical history as detailed below presents for evaluation.  Patient reports chronic alcohol consumption.  His last drink was 2 days ago.  He reports that he was consuming approximately fifth of hard liquor daily.  This evening he was noted to have apparent seizure-like activity.  Seizure episode lasted approximate 30 minutes.  He did bite his tongue.  He was incontinent of urine.  Patient denies prior history of significant alcohol withdrawal.  He denies prior history of seizure.  Patient is not postictal now.  He complains of feeling " shaky."  The history is provided by the patient and medical records.  Illness Location:  Patient is presenting with complaint of seizure.  Recent heavy alcohol consumption stopped 48 hours prior. Severity:  Severe Onset quality:  Sudden Timing:  Unable to specify Progression:  Partially resolved Chronicity:  New     Home Medications Prior to Admission medications   Medication Sig Start Date End Date Taking? Authorizing Provider  ADDERALL XR 30 MG 24 hr capsule Take 30 mg by mouth 2 (two) times daily. 07/05/17  Yes [provider]  ALPRAZolam Prudy Feeler) 1 MG tablet Take 1 mg by mouth 2 (two) times daily. 06/27/17  Yes [provider]  ANORO ELLIPTA 62.5-25 MCG/INH AEPB Inhale 1 puff into the lungs daily. 05/26/19  Yes [provider]  HYDROcodone-acetaminophen (NORCO/VICODIN) 5-325 MG tablet Take 1-2 tablets by mouth every 6 (six) hours as needed for moderate pain. 09/30/20 09/30/21  Kathryne Hitch, MD  methocarbamol (ROBAXIN) 500 MG tablet Take 1 tablet (500 mg total) by mouth 2 (two) times daily. 09/22/18   Palumbo, April, MD  methocarbamol (ROBAXIN) 500 MG tablet Take 1  tablet (500 mg total) by mouth every 6 (six) hours as needed for muscle spasms. 07/10/19   Kirtland Bouchard, PA-C  metoprolol tartrate (LOPRESSOR) 50 MG tablet Take 50 mg by mouth 2 (two) times daily. 05/21/17   [provider]  Multiple Vitamin (MULTIVITAMIN WITH MINERALS) TABS tablet Take 1 tablet by mouth daily.    [provider]  nicotine (NICODERM CQ - DOSED IN MG/24 HOURS) 21 mg/24hr patch Place 21 mg onto the skin daily. 05/28/19   [provider]  PROAIR HFA 108 (90 Base) MCG/ACT inhaler Inhale 2 puffs into the lungs every 4 (four) hours as needed. 06/27/17   [provider]  sildenafil (REVATIO) 20 MG tablet Take by mouth. 03/03/19   [provider]  Testosterone 1.62 % GEL Apply 1 Pump topically daily. To shoulder 05/26/19   [provider]      Allergies    Patient has no known allergies.    Review of Systems   Review of Systems  All other systems reviewed and are negative.  Physical Exam Updated Vital Signs BP 124/86   Pulse 86   Temp 98.4 F (36.9 C) (Oral)   Resp 20   Ht 5\' 9"  (1.753 m)   Wt 102.1 kg   SpO2 98%   BMI 33.23 kg/m  Physical Exam Vitals and nursing note reviewed.  Constitutional:      General: He is not in acute distress.    Appearance: Normal appearance. He is well-developed.  HENT:  Head: Normocephalic and atraumatic.     Mouth/Throat:     Comments: Small puncture wound noted to the left lateral lip where the patient bit it -no active bleeding Eyes:     Conjunctiva/sclera: Conjunctivae normal.     Pupils: Pupils are equal, round, and reactive to light.  Cardiovascular:     Rate and Rhythm: Regular rhythm. Tachycardia present.     Heart sounds: Normal heart sounds.  Pulmonary:     Effort: Pulmonary effort is normal. No respiratory distress.     Breath sounds: Normal breath sounds.  Abdominal:     General: There is no distension.     Palpations: Abdomen is soft.     Tenderness: There is no  abdominal tenderness.  Genitourinary:    Comments: Patient was incontinent of urine with reported seizure-like episode Musculoskeletal:        General: No deformity. Normal range of motion.     Cervical back: Normal range of motion and neck supple.  Skin:    General: Skin is warm and dry.  Neurological:     General: No focal deficit present.     Mental Status: He is alert and oriented to person, place, and time. Mental status is at baseline.    ED Results / Procedures / Treatments   Labs (all labs ordered are listed, but only abnormal results are displayed) Labs Reviewed  COMPREHENSIVE METABOLIC PANEL - Abnormal; Notable for the following components:      Result Value   CO2 20 (*)    Glucose, Bld 142 (*)    All other components within normal limits  CBC WITH DIFFERENTIAL/PLATELET - Abnormal; Notable for the following components:   WBC 11.5 (*)    RBC 5.91 (*)    Hemoglobin 17.9 (*)    HCT 53.1 (*)    Abs Immature Granulocytes 0.12 (*)    All other components within normal limits  CBG MONITORING, ED - Abnormal; Notable for the following components:   Glucose-Capillary 138 (*)    All other components within normal limits  RESP PANEL BY RT-PCR (FLU A&B, COVID) ARPGX2  ETHANOL  RAPID URINE DRUG SCREEN, HOSP PERFORMED    EKG EKG Interpretation  Date/Time:  Monday June 06 2021 17:45:21 EDT Ventricular Rate:  114 PR Interval:  164 QRS Duration: 136 QT Interval:  334 QTC Calculation: 460 R Axis:   65 Text Interpretation: Sinus tachycardia Right bundle branch block Probable inferior infarct, recent Confirmed by Kristine Royal [phone]) on 06/06/2021 6:01:36 PM  Radiology CT Head Wo Contrast  Result Date: 06/06/2021 CLINICAL DATA:  Seizure, new-onset, no history of trauma EXAM: CT HEAD WITHOUT CONTRAST TECHNIQUE: Contiguous axial images were obtained from the base of the skull through the vertex without intravenous contrast. RADIATION DOSE REDUCTION: This exam was performed  according to the departmental dose-optimization program which includes automated exposure control, adjustment of the mA and/or kV according to patient size and/or use of iterative reconstruction technique. COMPARISON:  CT head 06/05/2020 FINDINGS: Brain: No evidence of large-territorial acute infarction. No parenchymal hemorrhage. No mass lesion. No extra-axial collection. No mass effect or midline shift. No hydrocephalus. Basilar cisterns are patent. Vascular: No hyperdense vessel. Skull: No acute fracture or focal lesion. Sinuses/Orbits: Paranasal sinuses and mastoid air cells are clear. The orbits are unremarkable. Other: None. IMPRESSION: No acute intracranial abnormality. Electronically Signed   By: Tish Frederickson M.D.   On: 06/06/2021 21:14    Procedures Procedures    Medications Ordered in ED Medications  LORazepam (ATIVAN) tablet 1-4 mg ( Oral See Alternative 06/06/21 2215)    Or  LORazepam (ATIVAN) injection 1-4 mg (2 mg Intravenous Given 06/06/21 2215)  thiamine tablet 100 mg (100 mg Oral Given 06/06/21 2213)    Or  thiamine (B-1) injection 100 mg ( Intravenous See Alternative 06/06/21 2213)  folic acid (FOLVITE) tablet 1 mg (1 mg Oral Given 06/06/21 2213)  multivitamin with minerals tablet 1 tablet (1 tablet Oral Given 06/06/21 2214)  LORazepam (ATIVAN) injection 2 mg (2 mg Intravenous Given 06/06/21 1804)  LORazepam (ATIVAN) injection 2 mg (2 mg Intravenous Given 06/06/21 1905)    ED Course/ Medical Decision Making/ A&P                           Medical Decision Making Amount and/or Complexity of Data Reviewed Labs: ordered. Radiology: ordered.  Risk Prescription drug management. Decision regarding hospitalization.    Medical Screen Complete  This patient presented to the ED with complaint of seizure, alcohol withdrawal.  This complaint involves an extensive number of treatment options. The initial differential diagnosis includes, but is not limited to, medical withdrawal,  DTs, metabolic abnormality, etc.  This presentation is: Acute, Chronic, Self-Limited, Previously Undiagnosed, Uncertain Prognosis, Complicated, Systemic Symptoms, and Threat to Life/Bodily Function  Patient is presenting with reported seizure-like activity.  Patient's last alcoholic intake was apparently 48 hours prior.  Patient reporting significant heavy daily alcohol consumption.  Presentation is concerning for seizure secondary to alcohol withdrawal  With administration of benzodiazepines patient is improved significantly.  Patient will require admission for further work-up and treatment of suspected DTs.   Co morbidities that complicated the patient's evaluation  History of heavy alcohol and drug use   Additional history obtained:  Additional history obtained from EMS External records from outside sources obtained and reviewed including prior ED visits and prior Inpatient records.    Lab Tests:  I ordered and personally interpreted labs.  The pertinent results include: CBC, CMP, EtOH, COVID, flu, urine drug screen   Imaging Studies ordered:  I ordered imaging studies including CT head I independently visualized and interpreted obtained imaging which showed NAD I agree with the radiologist interpretation.   Cardiac Monitoring:  The patient was maintained on a cardiac monitor.  I personally viewed and interpreted the cardiac monitor which showed an underlying rhythm of: Sinus tach   Medicines ordered:  I ordered medication including benzodiazepines for suspected alcohol withdrawal Reevaluation of the patient after these medicines showed that the patient: improved   Problem List / ED Course:  Suspected alcohol withdrawal, seizure   Reevaluation:  After the interventions noted above, I reevaluated the patient and found that they have: improved   Disposition:  After consideration of the diagnostic results and the patients response to treatment, I feel that  the patent would benefit from admission.   CRITICAL CARE Performed by: Wynetta Fines   Total critical care time: 30 minutes  Critical care time was exclusive of separately billable procedures and treating other patients.  Critical care was necessary to treat or prevent imminent or life-threatening deterioration.  Critical care was time spent personally by me on the following activities: development of treatment plan with patient and/or surrogate as well as nursing, discussions with consultants, evaluation of patient's response to treatment, examination of patient, obtaining history from patient or surrogate, ordering and performing treatments and interventions, ordering and review of laboratory studies, ordering and review of radiographic studies, pulse  oximetry and re-evaluation of patient's condition.        Final Clinical Impression(s) / ED Diagnoses Final diagnoses:  Seizure (HCC)  Alcohol withdrawal syndrome with complication [facility])    Rx / DC Orders ED Discharge Orders     None         Wynetta Fines, MD 06/07/21 0022

## 2021-06-07 ENCOUNTER — Observation Stay (HOSPITAL_COMMUNITY): Payer: Medicaid Other

## 2021-06-07 DIAGNOSIS — Z8673 Personal history of transient ischemic attack (TIA), and cerebral infarction without residual deficits: Secondary | ICD-10-CM | POA: Diagnosis not present

## 2021-06-07 DIAGNOSIS — F909 Attention-deficit hyperactivity disorder, unspecified type: Secondary | ICD-10-CM | POA: Diagnosis present

## 2021-06-07 DIAGNOSIS — E876 Hypokalemia: Secondary | ICD-10-CM | POA: Diagnosis not present

## 2021-06-07 DIAGNOSIS — R569 Unspecified convulsions: Secondary | ICD-10-CM

## 2021-06-07 DIAGNOSIS — F19939 Other psychoactive substance use, unspecified with withdrawal, unspecified: Secondary | ICD-10-CM | POA: Diagnosis not present

## 2021-06-07 DIAGNOSIS — F411 Generalized anxiety disorder: Secondary | ICD-10-CM | POA: Diagnosis present

## 2021-06-07 DIAGNOSIS — F1020 Alcohol dependence, uncomplicated: Secondary | ICD-10-CM | POA: Diagnosis not present

## 2021-06-07 DIAGNOSIS — D72829 Elevated white blood cell count, unspecified: Secondary | ICD-10-CM | POA: Diagnosis present

## 2021-06-07 DIAGNOSIS — F10939 Alcohol use, unspecified with withdrawal, unspecified: Secondary | ICD-10-CM | POA: Diagnosis not present

## 2021-06-07 DIAGNOSIS — I10 Essential (primary) hypertension: Secondary | ICD-10-CM | POA: Diagnosis present

## 2021-06-07 DIAGNOSIS — F17210 Nicotine dependence, cigarettes, uncomplicated: Secondary | ICD-10-CM | POA: Diagnosis present

## 2021-06-07 DIAGNOSIS — Y900 Blood alcohol level of less than 20 mg/100 ml: Secondary | ICD-10-CM | POA: Diagnosis present

## 2021-06-07 DIAGNOSIS — F10239 Alcohol dependence with withdrawal, unspecified: Secondary | ICD-10-CM | POA: Diagnosis present

## 2021-06-07 DIAGNOSIS — Z79899 Other long term (current) drug therapy: Secondary | ICD-10-CM | POA: Diagnosis not present

## 2021-06-07 DIAGNOSIS — Z20822 Contact with and (suspected) exposure to covid-19: Secondary | ICD-10-CM | POA: Diagnosis present

## 2021-06-07 MED ORDER — ALBUTEROL SULFATE (2.5 MG/3ML) 0.083% IN NEBU: 2.5000 mg | INHALATION_SOLUTION | RESPIRATORY_TRACT | Status: DC | PRN

## 2021-06-07 MED ORDER — UMECLIDINIUM-VILANTEROL 62.5-25 MCG/ACT IN AEPB
1.0000 | INHALATION_SPRAY | Freq: Every day | RESPIRATORY_TRACT | Status: DC
  Administered 2021-06-08 – 2021-06-09 (×2): 1 via RESPIRATORY_TRACT
  Filled 2021-06-07: qty 14

## 2021-06-07 MED ORDER — MORPHINE SULFATE (PF) 2 MG/ML IV SOLN
2.0000 mg | INTRAVENOUS | Status: DC | PRN
  Administered 2021-06-07 – 2021-06-09 (×3): 2 mg via INTRAVENOUS
  Filled 2021-06-07 (×3): qty 1

## 2021-06-07 MED ORDER — BENZOCAINE 10 % MT GEL
Freq: Four times a day (QID) | OROMUCOSAL | Status: DC | PRN
  Administered 2021-06-08: 1 via OROMUCOSAL
  Filled 2021-06-07: qty 9

## 2021-06-07 MED ORDER — POTASSIUM CHLORIDE CRYS ER 20 MEQ PO TBCR
40.0000 meq | EXTENDED_RELEASE_TABLET | Freq: Once | ORAL | Status: AC
  Administered 2021-06-07: 40 meq via ORAL
  Filled 2021-06-07: qty 2

## 2021-06-07 MED ORDER — NICOTINE 21 MG/24HR TD PT24
21.0000 mg | MEDICATED_PATCH | Freq: Every day | TRANSDERMAL | Status: DC
  Administered 2021-06-07 – 2021-06-08 (×2): 21 mg via TRANSDERMAL
  Filled 2021-06-07 (×2): qty 1

## 2021-06-07 MED ORDER — METOPROLOL TARTRATE 50 MG PO TABS
50.0000 mg | ORAL_TABLET | Freq: Two times a day (BID) | ORAL | Status: DC
  Administered 2021-06-07 – 2021-06-09 (×5): 50 mg via ORAL
  Filled 2021-06-07 (×5): qty 1

## 2021-06-07 MED ORDER — ENOXAPARIN SODIUM 40 MG/0.4ML IJ SOSY
40.0000 mg | PREFILLED_SYRINGE | INTRAMUSCULAR | Status: DC
  Administered 2021-06-07 – 2021-06-08 (×2): 40 mg via SUBCUTANEOUS
  Filled 2021-06-07 (×2): qty 0.4

## 2021-06-07 NOTE — Procedures (Signed)
Patient Name: Daniel Solis  ?MRN: [id number]  ?Epilepsy Attending: Charlsie Quest  ?Referring Physician/Provider: Anselm Jungling, DO ?Date: 06/07/2021 ?Duration: 21.30 mins ? ?Patient history: 46 year old male with seizure likely secondary to alcohol withdrawal.  EEG evaluate for seizure. ? ?Level of alertness: Awake ? ?AEDs during EEG study: Ativan ? ?Technical aspects: This EEG study was done with scalp electrodes positioned according to the 10-20 International system of electrode placement. Electrical activity was acquired at a sampling rate of 500Hz  and reviewed with a high frequency filter of 70Hz  and a low frequency filter of 1Hz . EEG data were recorded continuously and digitally stored.  ? ?Description: The posterior dominant rhythm consists of 9 Hz activity of moderate voltage (25-35 uV) seen predominantly in posterior head regions, symmetric and reactive to eye opening and eye closing. Hyperventilation and photic stimulation were not performed.    ? ?IMPRESSION: ?This study is within normal limits. No seizures or epileptiform discharges were seen throughout the recording. ? ?  ? ?

## 2021-06-07 NOTE — Assessment & Plan Note (Addendum)
Counseled on need for complete cessation.  Still endorses 1 pack/day. ?

## 2021-06-07 NOTE — Assessment & Plan Note (Addendum)
On Xanax 1 mg p.o. twice daily at home. ?
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
## 2021-06-07 NOTE — Plan of Care (Signed)
Received patient from ED. Patient lethargic but responds to brief questioning. Patient settled into room/bed. Will continue to monitor according to orders and care plan. ?

## 2021-06-07 NOTE — Progress Notes (Signed)
EEG complete - results pending 

## 2021-06-07 NOTE — Assessment & Plan Note (Addendum)
On Adderall 30 mg p.o. daily at home. ?
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
## 2021-06-07 NOTE — Progress Notes (Signed)
?  Transition of Care (TOC) Screening Note ? ? ?Patient Details  ?Name: Daniel Solis ?Date of Birth: Jan 05, 1976 ? ? ?Transition of Care (TOC) CM/SW Contact:    ?Pollie Friar, RN ?Phone Number: ?06/07/2021, 3:16 PM ? ? ? ?Transition of Care Department [facility]) has reviewed patient. We will continue to monitor patient advancement through interdisciplinary progression rounds. If new patient transition needs arise, please place a TOC consult. ?TOC following for substance abuse resources once he is able to understand the information.  ?  ?

## 2021-06-08 DIAGNOSIS — E876 Hypokalemia: Secondary | ICD-10-CM | POA: Diagnosis present

## 2021-06-08 DIAGNOSIS — F909 Attention-deficit hyperactivity disorder, unspecified type: Secondary | ICD-10-CM | POA: Diagnosis not present

## 2021-06-08 DIAGNOSIS — R569 Unspecified convulsions: Secondary | ICD-10-CM | POA: Diagnosis not present

## 2021-06-08 DIAGNOSIS — F10939 Alcohol use, unspecified with withdrawal, unspecified: Secondary | ICD-10-CM | POA: Diagnosis not present

## 2021-06-08 DIAGNOSIS — F1020 Alcohol dependence, uncomplicated: Secondary | ICD-10-CM | POA: Diagnosis not present

## 2021-06-08 LAB — BASIC METABOLIC PANEL
Anion gap: 10 (ref 5–15)
BUN: 14 mg/dL (ref 6–20)
CO2: 23 mmol/L (ref 22–32)
Calcium: 8.7 mg/dL — ABNORMAL LOW (ref 8.9–10.3)
Chloride: 105 mmol/L (ref 98–111)
Creatinine, Ser: 0.94 mg/dL (ref 0.61–1.24)
GFR, Estimated: >60 mL/min (ref >60)
Glucose, Bld: 87 mg/dL (ref 70–99)
Potassium: 3.4 mmol/L — ABNORMAL LOW (ref 3.5–5.1)
Sodium: 138 mmol/L (ref 135–145)

## 2021-06-08 LAB — CBC
HCT: 48.6 % (ref 39.0–52.0)
Hemoglobin: 16.3 g/dL (ref 13.0–17.0)
MCH: 29.9 pg (ref 26.0–34.0)
MCHC: 33.5 g/dL (ref 30.0–36.0)
MCV: 89.2 fL (ref 80.0–100.0)
Platelets: 141 10*3/uL — ABNORMAL LOW (ref 150–400)
RBC: 5.45 MIL/uL (ref 4.22–5.81)
RDW: 13 % (ref 11.5–15.5)
WBC: 9.6 10*3/uL (ref 4.0–10.5)
nRBC: 0 % (ref 0.0–0.2)

## 2021-06-08 LAB — MAGNESIUM: Magnesium: 2.1 mg/dL (ref 1.7–2.4)

## 2021-06-08 MED ORDER — ALPRAZOLAM 0.5 MG PO TABS
1.0000 mg | ORAL_TABLET | Freq: Two times a day (BID) | ORAL | Status: DC | PRN
  Administered 2021-06-08 – 2021-06-09 (×2): 1 mg via ORAL
  Filled 2021-06-08 (×2): qty 2

## 2021-06-08 MED ORDER — POTASSIUM CHLORIDE CRYS ER 20 MEQ PO TBCR
30.0000 meq | EXTENDED_RELEASE_TABLET | ORAL | Status: AC
  Administered 2021-06-08 (×2): 30 meq via ORAL
  Filled 2021-06-08 (×2): qty 1
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
## 2021-06-08 NOTE — Evaluation (Signed)
Physical Therapy Evaluation & Discharge ?Patient Details ?Name: Daniel Solis ?MRN: [id number] ?DOB: [birth year] ?Today's Date: 06/08/2021 ? ?History of Present Illness ? 46 y/o male presented to ED on 06/06/21 for witnessed seizure. Patient recently quit drinking x 2 days ago. CT negative. EEG negative. PMH:: ETOH abuse, HTN, PTSD, ADHD, tobacco use disorder.  ?Clinical Impression ? Patient admitted with the above. Patient ambulating at independent level and scoring 24/24 on DGI. Noted STM deficits and poor awareness into current situation during session. Patient is impulsive and demos poor safety awareness. No further skilled PT needs required acutely. No PT follow up recommended at this time. PT will sign off.    ?   ? ?Recommendations for follow up therapy are one component of a multi-disciplinary discharge planning process, led by the attending physician.  Recommendations may be updated based on patient status, additional functional criteria and insurance authorization. ? ?Follow Up Recommendations No PT follow up ? ?  ?Assistance Recommended at Discharge PRN  ?Patient can return home with the following ?   ? ?  ?Equipment Recommendations None recommended by PT  ?Recommendations for Other Services ?    ?  ?Functional Status Assessment Patient has not had a recent decline in their functional status  ? ?  ?Precautions / Restrictions Precautions ?Precautions: None ?Restrictions ?Weight Bearing Restrictions: No  ? ?  ? ?Mobility ? Bed Mobility ?Overal bed mobility: Independent ?  ?  ?  ?  ?  ?  ?  ?  ? ?Transfers ?Overall transfer level: Independent ?  ?  ?  ?  ?  ?  ?  ?  ?  ?  ? ?Ambulation/Gait ?Ambulation/Gait assistance: Independent ?Gait Distance (Feet): 300 Feet ?Assistive device: None ?Gait Pattern/deviations: WFL(Within Functional Limits) ?  ?Gait velocity interpretation: >4.37 ft/sec, indicative of normal walking speed ?  ?  ? ?Stairs ?Stairs: Yes ?Stairs assistance: Independent ?Stair Management: No rails,  Alternating pattern, Forwards ?Number of Stairs: 2 ?  ? ?Wheelchair Mobility ?  ? ?Modified Rankin (Stroke Patients Only) ?  ? ?  ? ?Balance Overall balance assessment: Independent ?  ?  ?  ?  ?  ?  ?  ?  ?  ?  ?  ?  ?  ?  ?  ?Standardized Balance Assessment ?Standardized Balance Assessment : Dynamic Gait Index ?  ?Dynamic Gait Index ?Level Surface: Normal ?Change in Gait Speed: Normal ?Gait with Horizontal Head Turns: Normal ?Gait with Vertical Head Turns: Normal ?Gait and Pivot Turn: Normal ?Step Over Obstacle: Normal ?Step Around Obstacles: Normal ?Steps: Normal ?Total Score: 24 ?   ? ? ? ?Pertinent Vitals/Pain Pain Assessment ?Pain Assessment: Faces ?Faces Pain Scale: Hurts even more ?Pain Location: head, tongue ?Pain Descriptors / Indicators: Grimacing, Guarding ?Pain Intervention(s): Monitored during session, Repositioned  ? ? ?Home Living Family/patient expects to be discharged to:: Private residence ?Living Arrangements: Spouse/significant other;Children ?Available Help at Discharge: Family ?Type of Home: House ?Home Access: Stairs to enter ?Entrance Stairs-Rails: Right ?Entrance Stairs-Number of Steps: 3 ?  ?Home Layout: One level ?Home Equipment: None ?   ?  ?Prior Function Prior Level of Function : Independent/Modified Independent;Working/employed;Driving ?  ?  ?  ?  ?  ?  ?  ?  ?  ? ? ?Hand Dominance  ?   ? ?  ?Extremity/Trunk Assessment  ? Upper Extremity Assessment ?Upper Extremity Assessment: Overall WFL for tasks assessed ?  ? ?Lower Extremity Assessment ?Lower Extremity Assessment: Overall WFL for tasks assessed ?  ? ?  Cervical / Trunk Assessment ?Cervical / Trunk Assessment: Normal  ?Communication  ? Communication: No difficulties  ?Cognition Arousal/Alertness: Awake/alert ?Behavior During Therapy: Impulsive ?Overall Cognitive Status: Impaired/Different from baseline ?Area of Impairment: Memory, Awareness ?  ?  ?  ?  ?  ?  ?  ?  ?  ?  ?Memory: Decreased short-term memory ?  ?  ?Awareness: Emergent ?   ?General Comments: STM deficits noted with patient not remembering room number provided within 30 seconds. Patient with poor insight into situation and denial about correlation of ETOH and seizure. ?  ?  ? ?  ?General Comments   ? ?  ?Exercises    ? ?Assessment/Plan  ?  ?PT Assessment Patient does not need any further PT services  ?PT Problem List   ? ?   ?  ?PT Treatment Interventions     ? ?PT Goals (Current goals can be found in the Care Plan section)  ?Acute Rehab PT Goals ?Patient Stated Goal: to go home ?PT Goal Formulation: All assessment and education complete, DC therapy ? ?  ?Frequency   ?  ? ? ?Co-evaluation   ?  ?  ?  ?  ? ? ?  ?AM-PAC PT "6 Clicks" Mobility  ?Outcome Measure Help needed turning from your back to your side while in a flat bed without using bedrails?: None ?Help needed moving from lying on your back to sitting on the side of a flat bed without using bedrails?: None ?Help needed moving to and from a bed to a chair (including a wheelchair)?: None ?Help needed standing up from a chair using your arms (e.g., wheelchair or bedside chair)?: None ?Help needed to walk in hospital room?: None ?Help needed climbing 3-5 steps with a railing? : None ?6 Click Score: 24 ? ?  ?End of Session   ?Activity Tolerance: Patient tolerated treatment well ?Patient left: in bed;with call bell/phone within reach;with bed alarm set ?Nurse Communication: Mobility status ?PT Visit Diagnosis: Unsteadiness on feet (R26.81) ?  ? ?Time: 8101-7510 ?PT Time Calculation (min) (ACUTE ONLY): 15 min ? ? ?Charges:   PT Evaluation ?$PT Eval Low Complexity: 1 Low ?  ?  ?   ? ? ?Blaire Palomino A. Dan Humphreys, PT, DPT ?Acute Rehabilitation Services ?Pager [phone] ?Office [phone] ? ? ?[name] ?06/08/2021, 12:58 PM ? ?

## 2021-06-08 NOTE — Assessment & Plan Note (Addendum)
Potassium repleted during hospitalization.  Potassium 4.1 at time of discharge. ?

## 2021-06-08 NOTE — Plan of Care (Signed)
  Problem: Activity: Goal: Risk for activity intolerance will decrease Outcome: Progressing   Problem: Nutrition: Goal: Adequate nutrition will be maintained Outcome: Progressing   Problem: Coping: Goal: Level of anxiety will decrease Outcome: Progressing   Problem: Safety: Goal: Ability to remain free from injury will improve Outcome: Progressing   

## 2021-06-09 DIAGNOSIS — F10939 Alcohol use, unspecified with withdrawal, unspecified: Secondary | ICD-10-CM | POA: Diagnosis not present

## 2021-06-09 DIAGNOSIS — R569 Unspecified convulsions: Secondary | ICD-10-CM | POA: Diagnosis not present

## 2021-06-09 DIAGNOSIS — F909 Attention-deficit hyperactivity disorder, unspecified type: Secondary | ICD-10-CM | POA: Diagnosis not present

## 2021-06-09 DIAGNOSIS — F1020 Alcohol dependence, uncomplicated: Secondary | ICD-10-CM | POA: Diagnosis not present

## 2021-06-09 LAB — MAGNESIUM: Magnesium: 2.3 mg/dL (ref 1.7–2.4)

## 2021-06-09 LAB — BASIC METABOLIC PANEL
Anion gap: 11 (ref 5–15)
BUN: 15 mg/dL (ref 6–20)
CO2: 24 mmol/L (ref 22–32)
Calcium: 9 mg/dL (ref 8.9–10.3)
Chloride: 104 mmol/L (ref 98–111)
Creatinine, Ser: 1.07 mg/dL (ref 0.61–1.24)
GFR, Estimated: >60 mL/min (ref >60)
Glucose, Bld: 88 mg/dL (ref 70–99)
Potassium: 4.1 mmol/L (ref 3.5–5.1)
Sodium: 139 mmol/L (ref 135–145)

## 2021-06-09 MED ORDER — DISULFIRAM 250 MG PO TABS: 250.0000 mg | ORAL_TABLET | Freq: Every day | ORAL | 0 refills | Status: AC

## 2021-06-09 NOTE — Plan of Care (Signed)
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
## 2021-06-09 NOTE — Progress Notes (Signed)
Patient left floor before able review DC instructions, called and reviewed d/c instructions with wife Daniel Solis. Daniel Solis verbalized understanding of d/c instructions and there is a copy of the instructions in my chart. ? ?Kristell Wooding, Kae Heller, RN ?  ?

## 2021-06-09 NOTE — Plan of Care (Signed)
  Problem: Activity: Goal: Risk for activity intolerance will decrease Outcome: Progressing   Problem: Nutrition: Goal: Adequate nutrition will be maintained Outcome: Progressing   Problem: Coping: Goal: Level of anxiety will decrease Outcome: Progressing   

## 2021-06-09 NOTE — Discharge Instructions (Addendum)
Per [facility] statutes, patients with seizures are not allowed to drive until they have been seizure-free for six months. Use caution when using heavy equipment or power tools. Avoid working on ladders or at heights. Take showers instead of baths. Ensure the water temperature is not too high on the home water heater. Do not go swimming alone. Do not lock yourself in a room alone (i.e. bathroom). When caring for infants or small children, sit down when holding, feeding, or changing them to minimize risk of injury to the child in the event you have a seizure. Maintain good sleep hygiene. Avoid alcohol.  ? ?If Lyriq has another seizure, call 911 and bring them back to the ED if: ?      A.  The seizure lasts longer than 5 minutes.      ?      B.  The patient doesn't wake shortly after the seizure or has new problems such as difficulty seeing, speaking or moving following the seizure ?      C.  The patient was injured during the seizure ?      D.  The patient has a temperature over 102 F (39C) ?      E.  The patient vomited during the seizure and now is having trouble breathing ?

## 2021-06-09 NOTE — Progress Notes (Signed)
CSW met with patient to provide resources for alcohol cessation counseling. CSW highlighted programs that accepted patient's insurance, and suggested finding an [place] meeting that was convenient for him, as well. Patient took resources and was appreciative of discussion, said he was familiar with resources and would find assistance. No other TOC needs identified at this time. ? ?Laveda Abbe, LCSW ?Clinical Social Worker ?[phone] ? ?

## 2022-04-27 IMAGING — CT CT HEAD W/O CM
4 series · 17 of 47 positions shown, 19 images · non-contrast
Comparison: CT head 06/05/2020

CLINICAL DATA: Seizure, new-onset, no history of trauma



[Series 3: head without · axial · non-contrast · 0.46mm/px · z∈[-301,-171]mm · 7 of 36 slices shown, 9 images]
[im 5/36  brain]
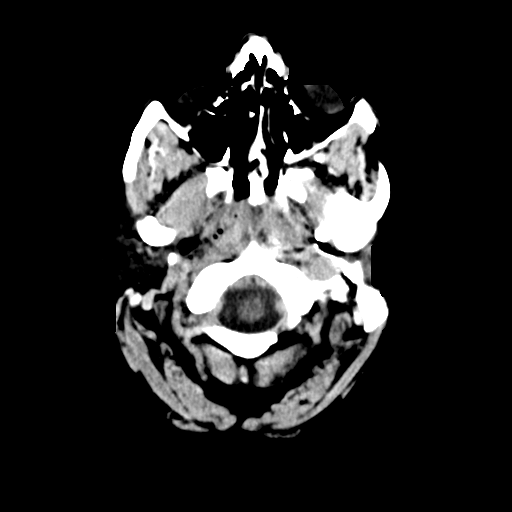
[im 5/36  bone]
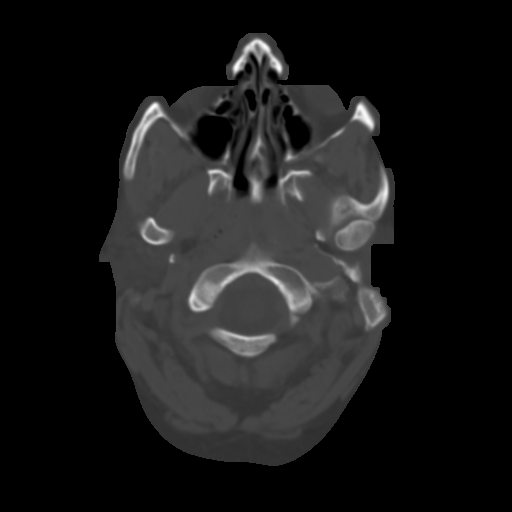
[im 9/36  brain]
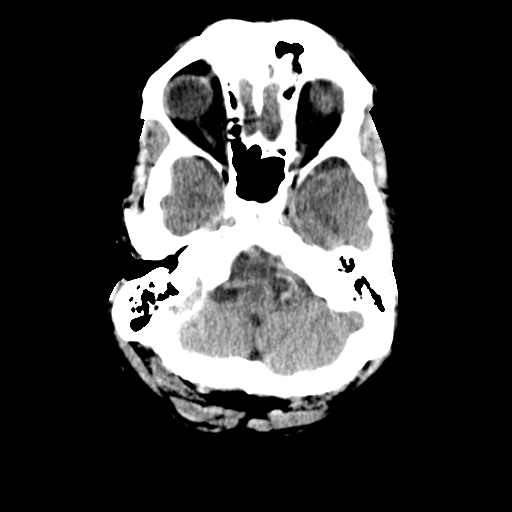
[im 14/36  brain]
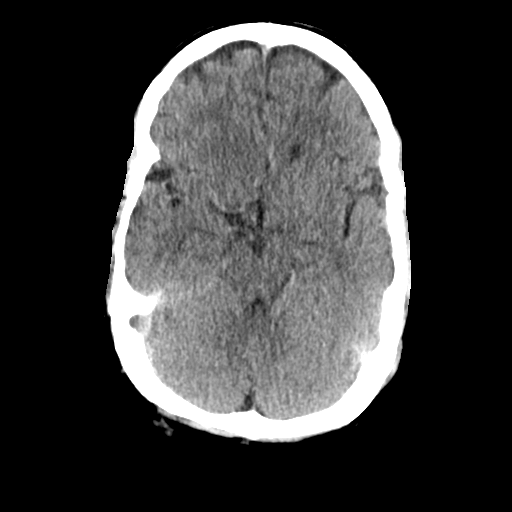
[im 18/36  brain]
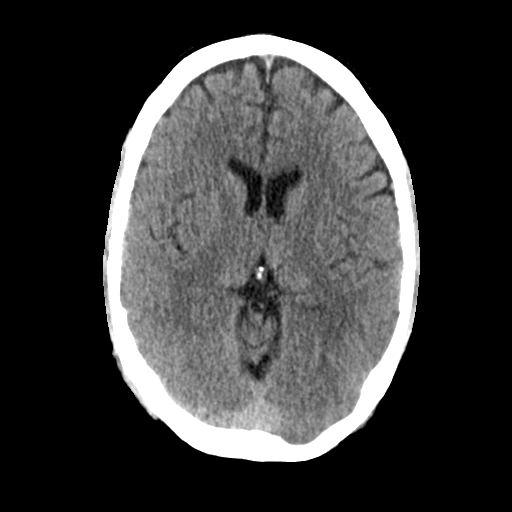
[im 22/36  brain]
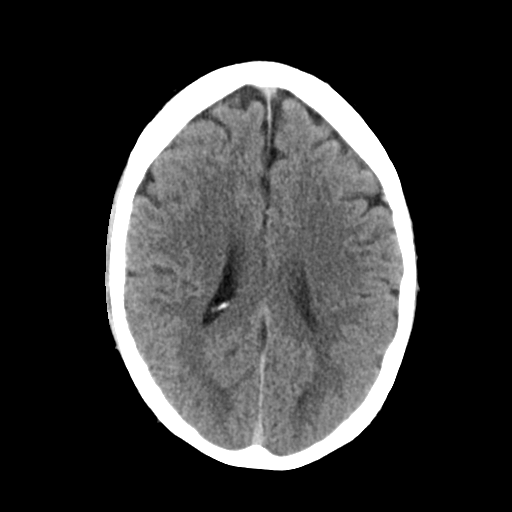
[im 22/36  bone]
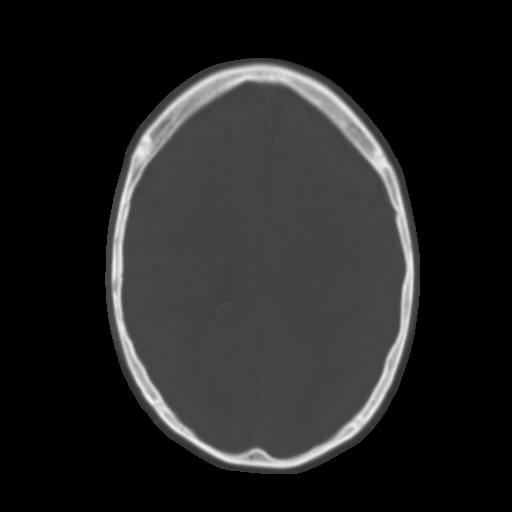
[im 27/36  brain]
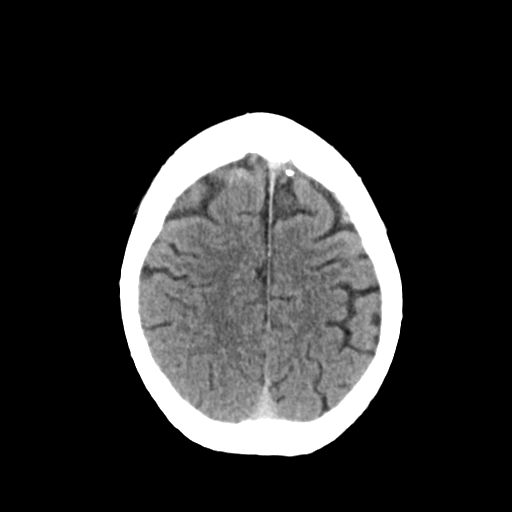
[im 31/36  brain]
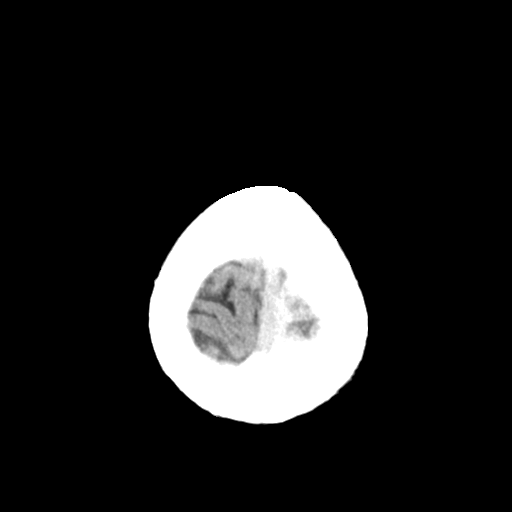

[Series 4: head bone · axial · 0.46mm/px · z∈[-305,-241]mm · 4 of 90 slices shown]
[im 9/90  bone]
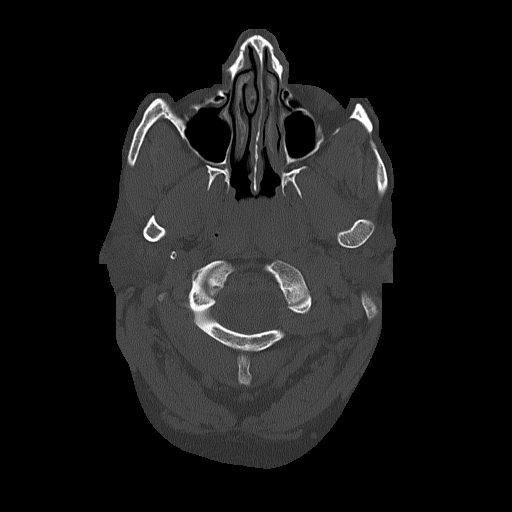
[im 18/90  bone]
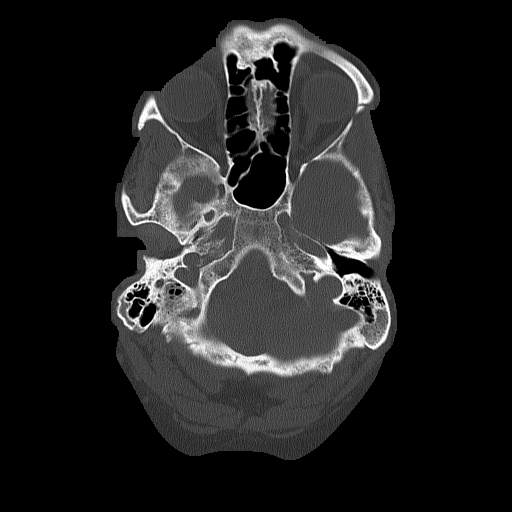
[im 27/90  bone]
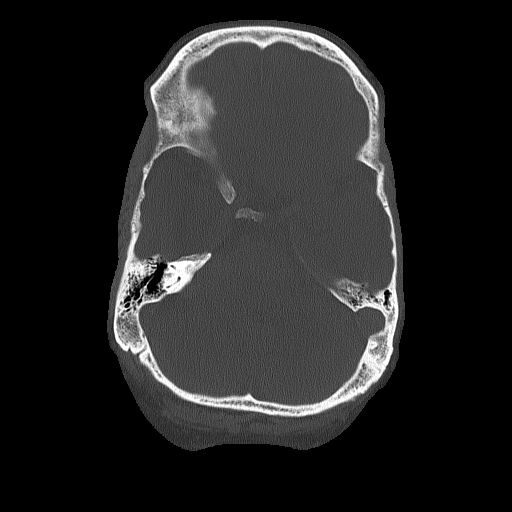
[im 41/90  bone]
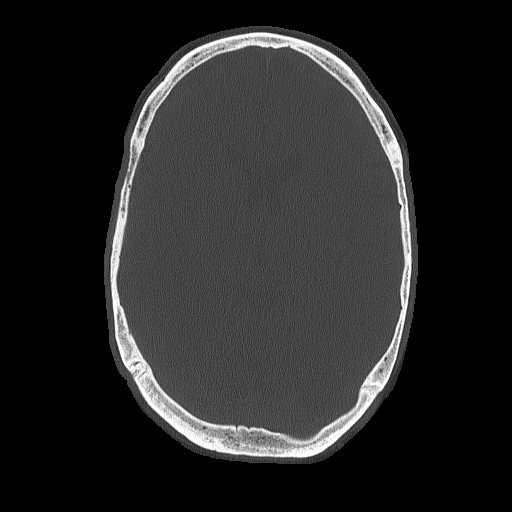

[Series 5: head without cor · coronal · non-contrast · 0.35mm/px · 3 of 76 slices shown]
[im 26/76  brain]
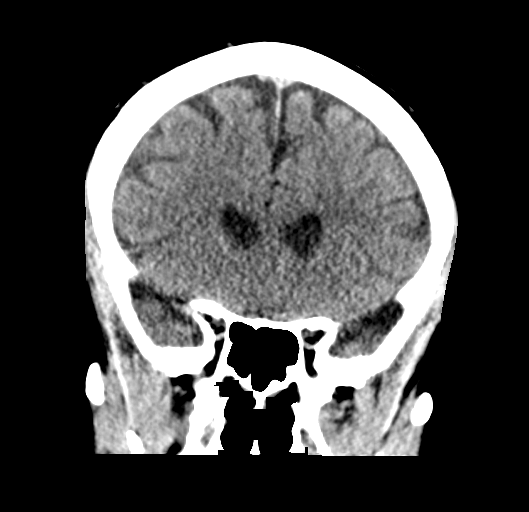
[im 34/76  brain]
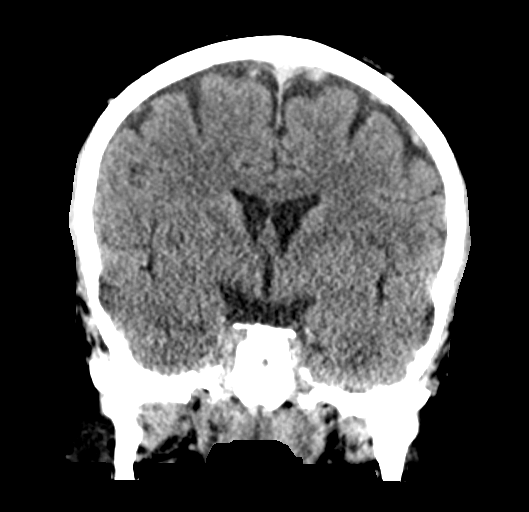
[im 42/76  brain]
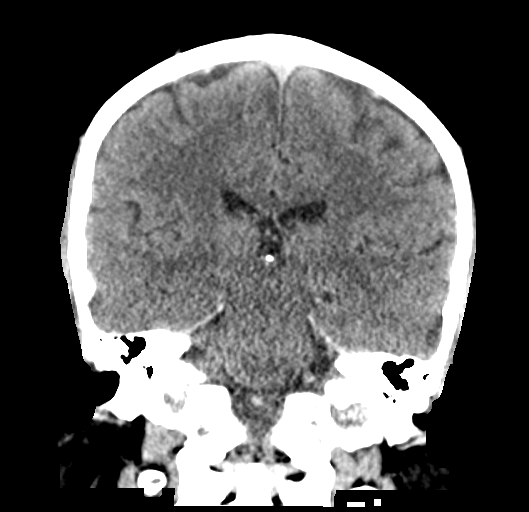

[Series 6: head without sag · sagittal · non-contrast · 0.35mm/px · 3 of 63 slices shown]
[im 21/63  brain]
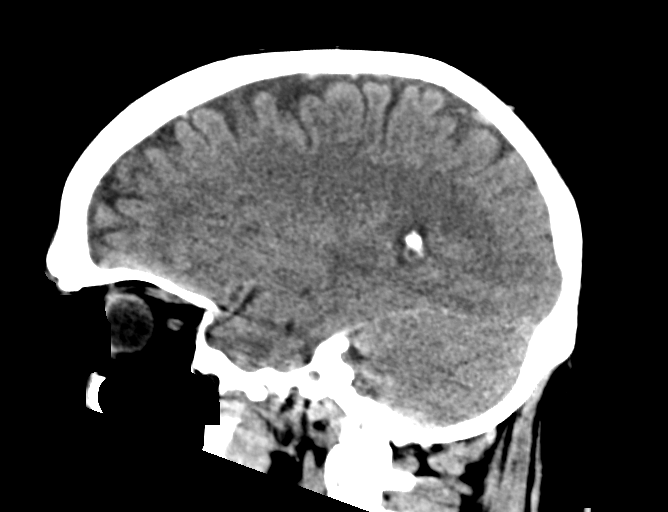
[im 32/63  brain]
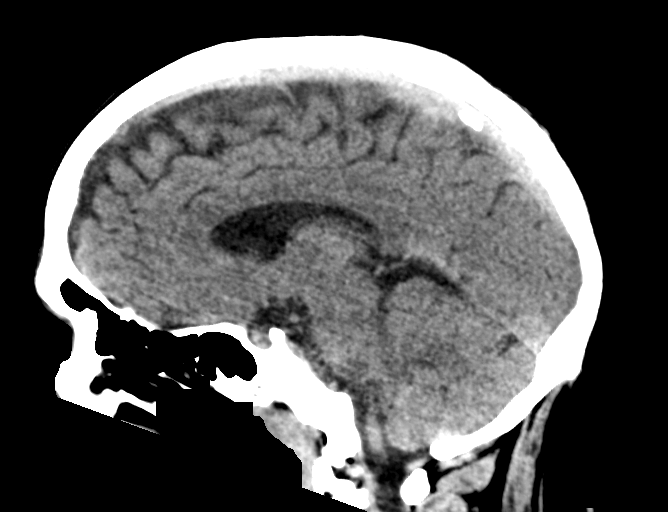
[im 42/63  brain]
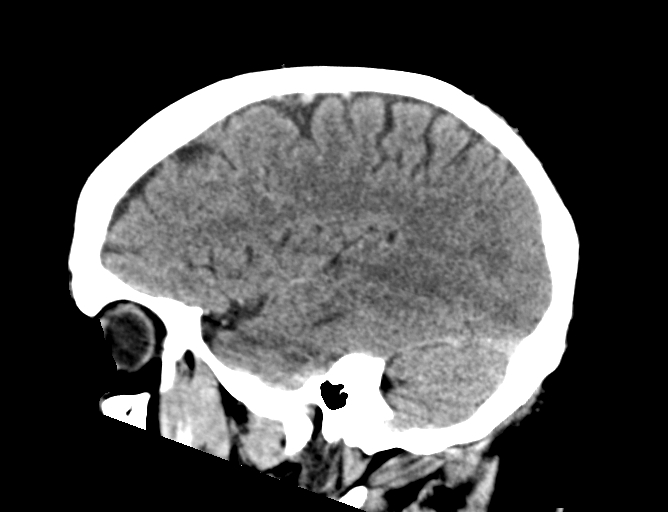

[17 of 47 positions shown; findings below may reference images not displayed]

FINDINGS: Brain:

No evidence of large-territorial acute infarction. No parenchymal
hemorrhage. No mass lesion. No extra-axial collection.

No mass effect or midline shift. No hydrocephalus. Basilar cisterns
are patent.

Vascular: No hyperdense vessel.

Skull: No acute fracture or focal lesion.

Sinuses/Orbits: Paranasal sinuses and mastoid air cells are clear.
The orbits are unremarkable.

Other: None.
IMPRESSION: No acute intracranial abnormality.
